# Patient Record
Sex: Female | Born: 1980 | Race: Black or African American | Hispanic: No
Health system: Western US, Academic
[De-identification: ages and names within clinical notes are randomized; demographics above are authoritative.]

## PROBLEM LIST (undated history)

## (undated) DIAGNOSIS — A630 Anogenital (venereal) warts: Secondary | ICD-10-CM

## (undated) DIAGNOSIS — B999 Unspecified infectious disease: Secondary | ICD-10-CM

## (undated) DIAGNOSIS — O365990 Maternal care for other known or suspected poor fetal growth, unspecified trimester, not applicable or unspecified: Secondary | ICD-10-CM

## (undated) DIAGNOSIS — Z8619 Personal history of other infectious and parasitic diseases: Secondary | ICD-10-CM

## (undated) DIAGNOSIS — D649 Anemia, unspecified: Secondary | ICD-10-CM

## (undated) DIAGNOSIS — IMO0002 Reserved for concepts with insufficient information to code with codable children: Secondary | ICD-10-CM

## (undated) DIAGNOSIS — R87619 Unspecified abnormal cytological findings in specimens from cervix uteri: Secondary | ICD-10-CM

## (undated) HISTORY — DX: Reserved for concepts with insufficient information to code with codable children: IMO0002

## (undated) HISTORY — DX: Unspecified abnormal cytological findings in specimens from cervix uteri: R87.619

## (undated) HISTORY — DX: Anemia, unspecified: D64.9

## (undated) HISTORY — DX: Anogenital (venereal) warts: A63.0

## (undated) HISTORY — PX: NO PAST SURGERIES: SHX2092

## (undated) HISTORY — DX: Personal history of other infectious and parasitic diseases: Z86.19

## (undated) HISTORY — DX: Unspecified infectious disease: B99.9

## (undated) DEATH — deceased

---

## 2005-11-07 ENCOUNTER — Emergency Department: Payer: Self-pay | Admitting: Emergency Medicine

## 2006-01-01 ENCOUNTER — Emergency Department: Payer: Self-pay | Admitting: General Practice

## 2006-03-09 ENCOUNTER — Emergency Department: Payer: Self-pay | Admitting: General Practice

## 2009-05-20 ENCOUNTER — Emergency Department: Payer: Self-pay | Admitting: Emergency Medicine

## 2010-02-25 ENCOUNTER — Emergency Department: Payer: Self-pay | Admitting: Unknown Physician Specialty

## 2011-09-23 ENCOUNTER — Emergency Department: Payer: Self-pay | Admitting: *Deleted

## 2011-09-26 ENCOUNTER — Emergency Department: Payer: Self-pay | Admitting: Unknown Physician Specialty

## 2011-10-02 ENCOUNTER — Emergency Department: Payer: Self-pay | Admitting: Emergency Medicine

## 2012-05-10 ENCOUNTER — Emergency Department: Payer: Self-pay | Admitting: *Deleted

## 2012-10-24 IMAGING — CT CT ABD-PELV W/ CM
1 of 2 series · 15 of 32 positions shown, 19 images · non-contrast
Comparison: none

REASON FOR EXAM: (1) **No PO Contrast Please**; Fever, RLQ pain, normal
pelvic exam, normal UA; (
COMMENTS:   May transport without cardiac monitor

PROCEDURE:     CT  - CT ABDOMEN / PELVIS  W  - October 02, 2011  [DATE]
RESULT:     CT abdomen and pelvis dated 10/02/2011.
TECHNIQUE: Helical 3 mm sections were obtained from the lung bases through
the pubic symphysis.

[Series 2: 3mm soft tissue · axial · 0.64mm/px · z∈[-472,-90]mm · 15 of 139 slices shown, 19 images]
[im 6/139  soft-tissue]
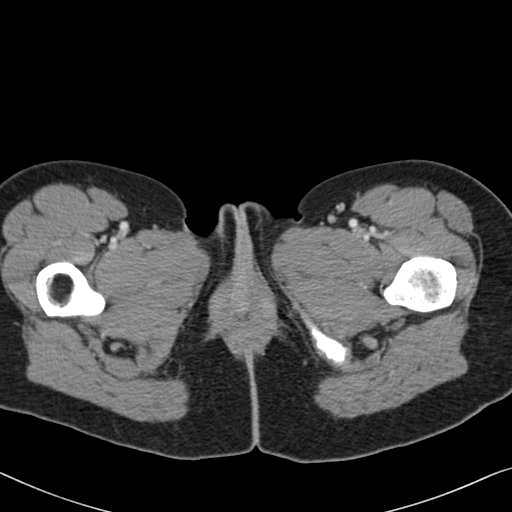
[im 6/139  bone]
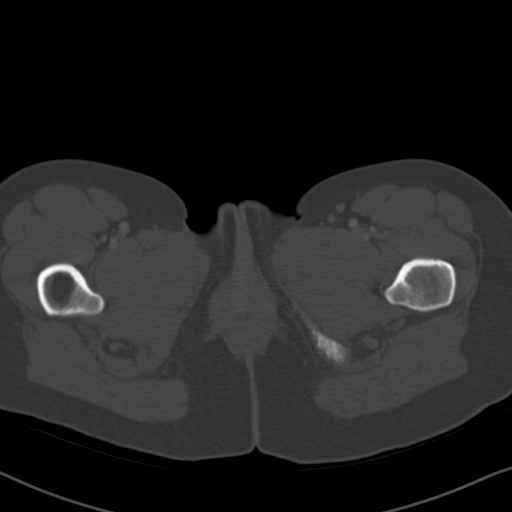
[im 18/139  soft-tissue]
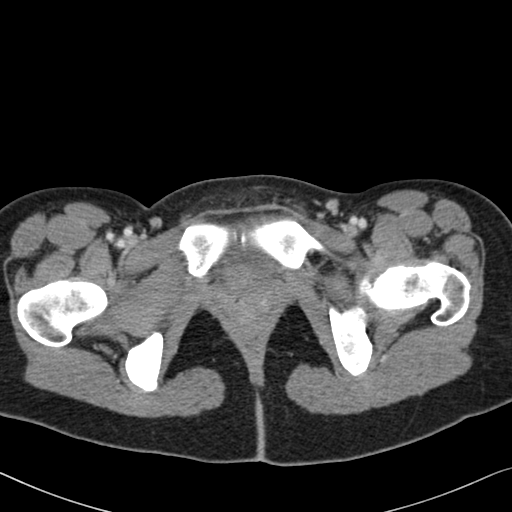
[im 29/139  soft-tissue]
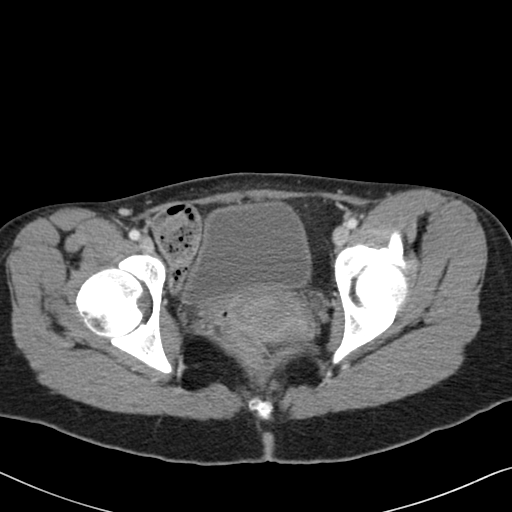
[im 41/139  soft-tissue]
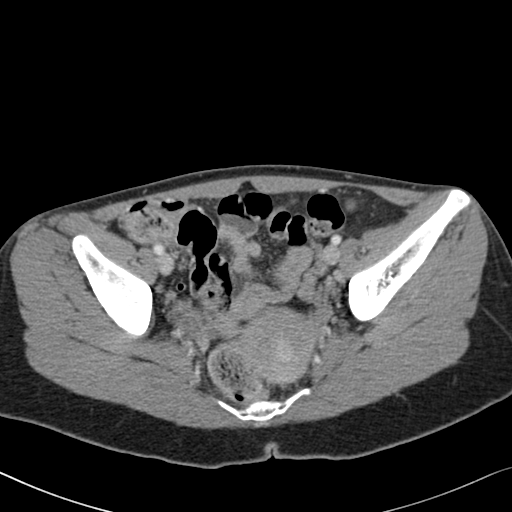
[im 47/139  soft-tissue]
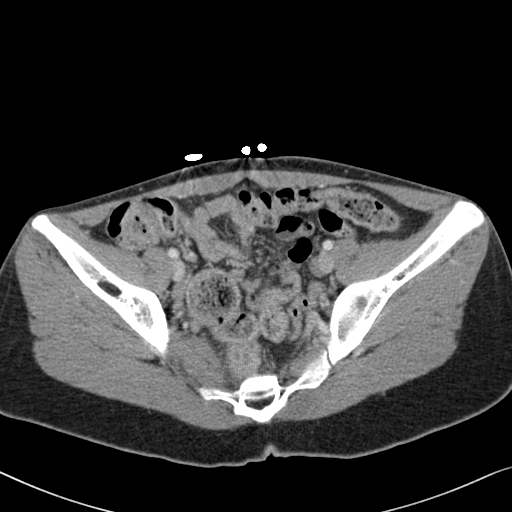
[im 58/139  soft-tissue]
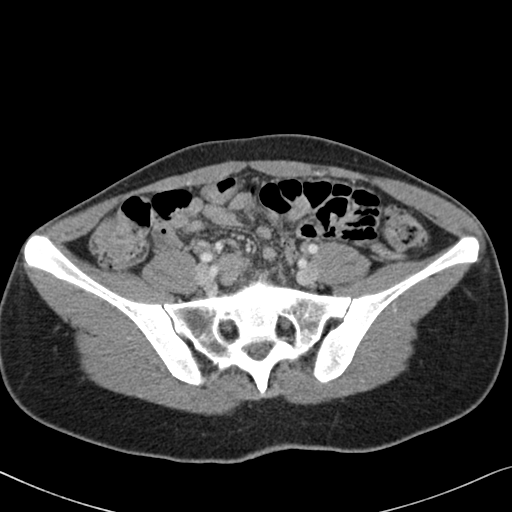
[im 70/139  soft-tissue]
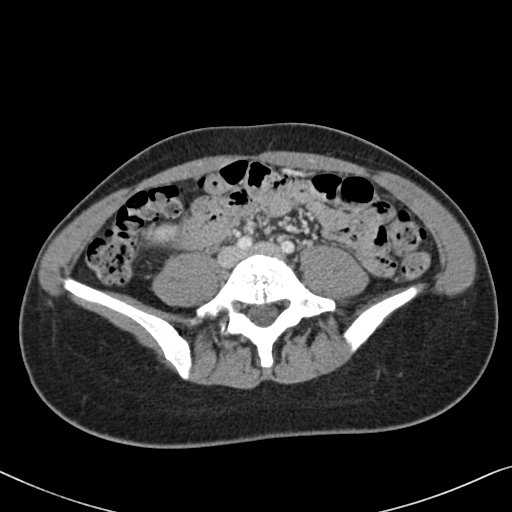
[im 81/139  soft-tissue]
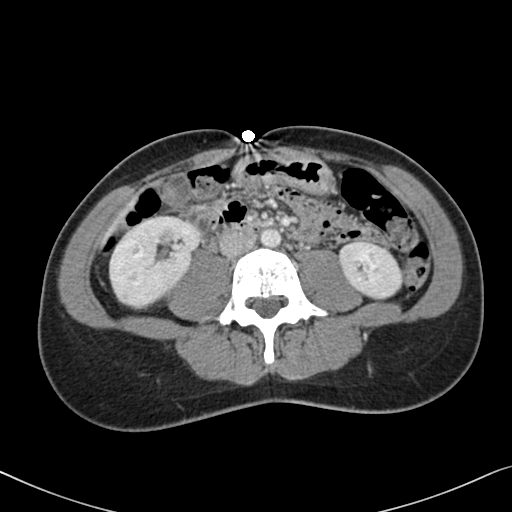
[im 93/139  soft-tissue]
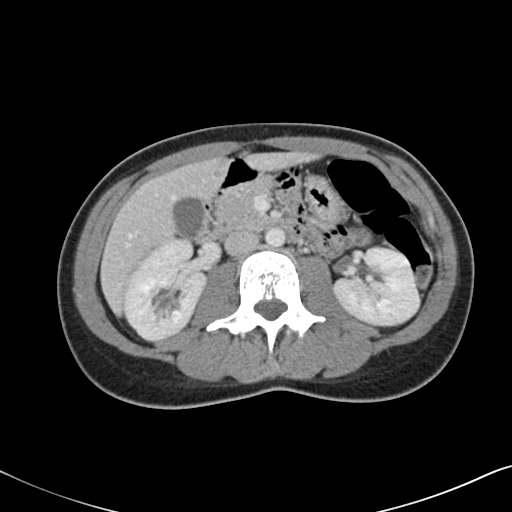
[im 93/139  bone]
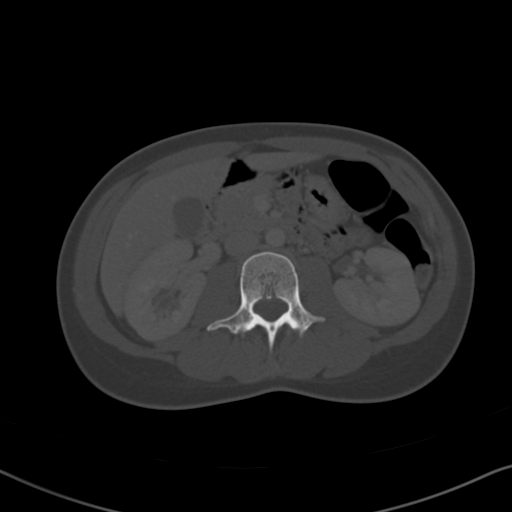
[im 98/139  soft-tissue]
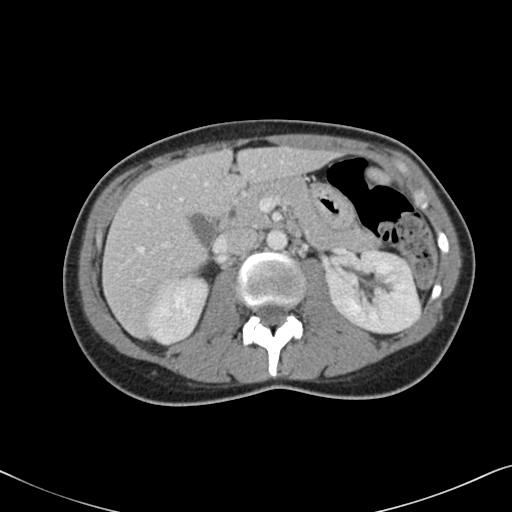
[im 110/139  soft-tissue]
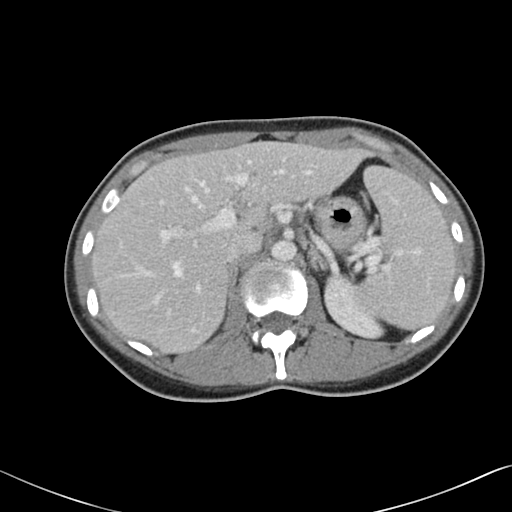
[im 116/139  lung]
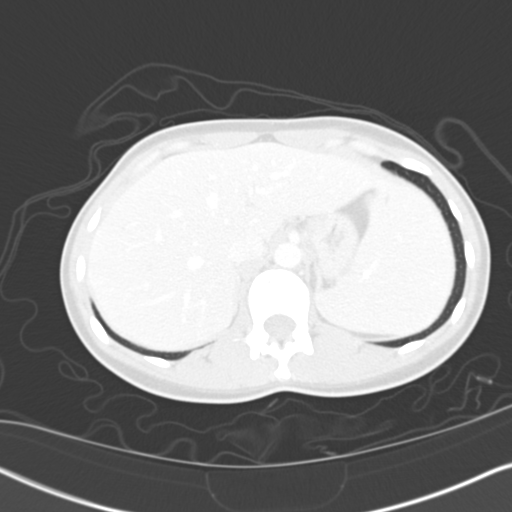
[im 121/139  soft-tissue]
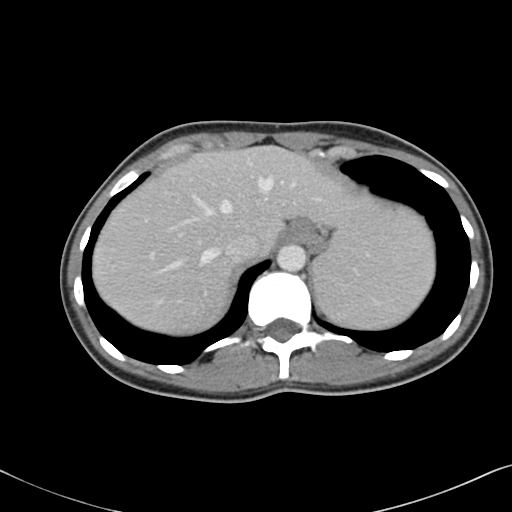
[im 121/139  lung]
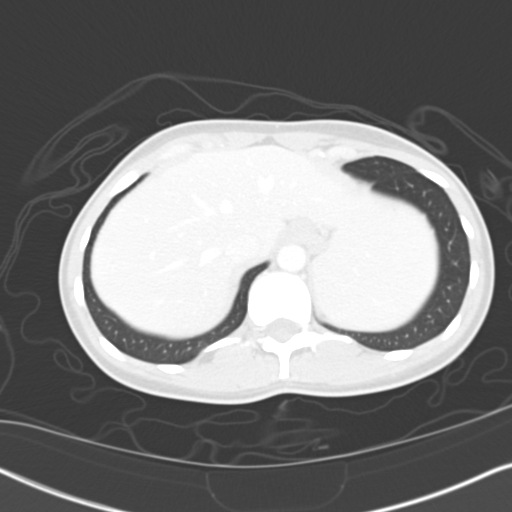
[im 127/139  lung]
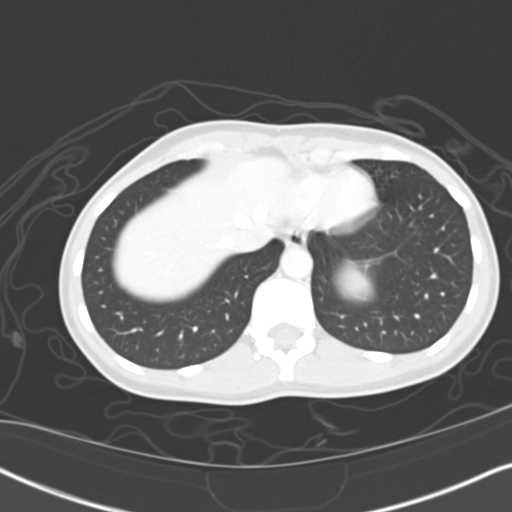
[im 133/139  soft-tissue]
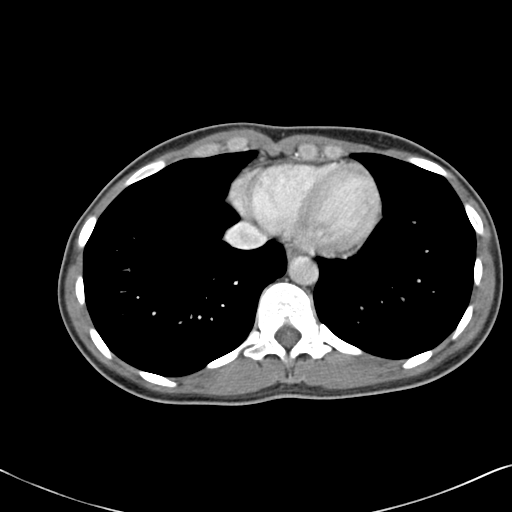
[im 133/139  lung]
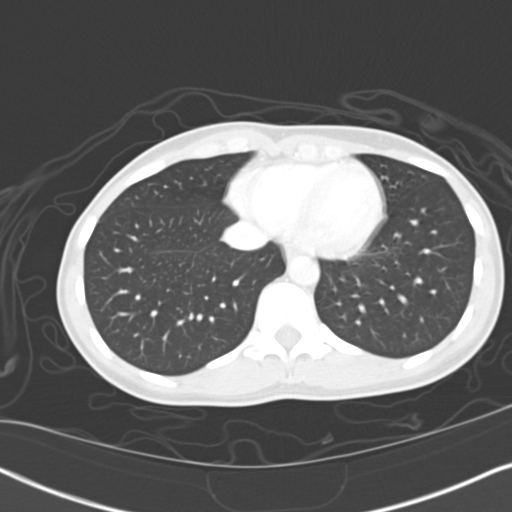

[15 of 32 positions shown; findings below may reference images not displayed]

FINDINGS: There is no evidence of abdominal or pelvic free fluid, loculated
fluid collections, masses, or adenopathy. The appendix is identified and is
unremarkable.

The liver, spleen, adrenals, pancreas are unremarkable few small low
attenuating focus projects within the dome of the right lobe of the liver
likely thinning a small hemangioma versus a small cyst versus a biliary
hamartoma. There is no CT evidence of abdominal aortic aneurysm.
IMPRESSION: No CT evidence of obstructive or inflammatory
abnormalities. Note a moderate amount of stool is appreciated throughout the
colon.

## 2012-10-24 IMAGING — CR DG CHEST 2V
1 series · 2 of 2 positions shown · non-contrast
Comparison: none

REASON FOR EXAM: fever, unclear origin
COMMENTS:

PROCEDURE:     DXR - DXR CHEST PA (OR AP) AND LATERAL  - October 02, 2011  [DATE]
RESULT:     Two-view chest dated 10/02/2011.

[Series 1: view not recorded · 0.17mm/px · 2 of 2 slices shown]
[im 1/2]
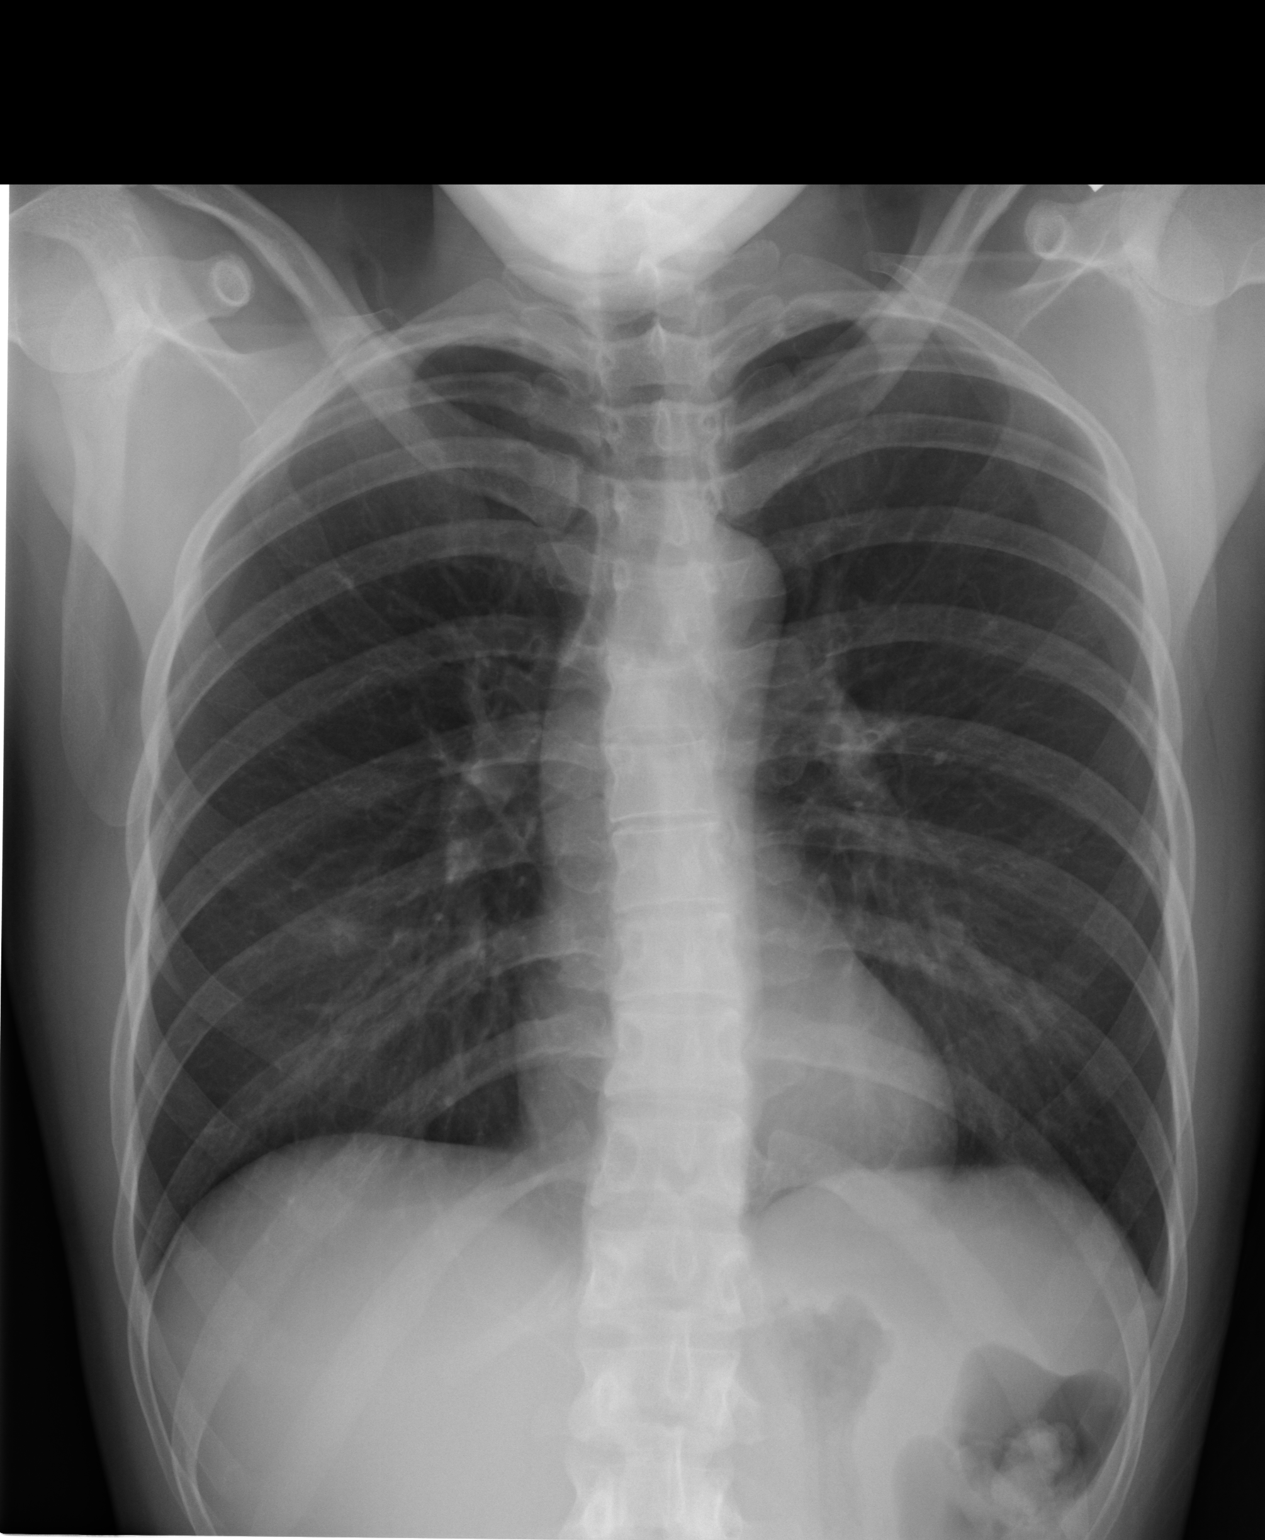
[im 2/2]
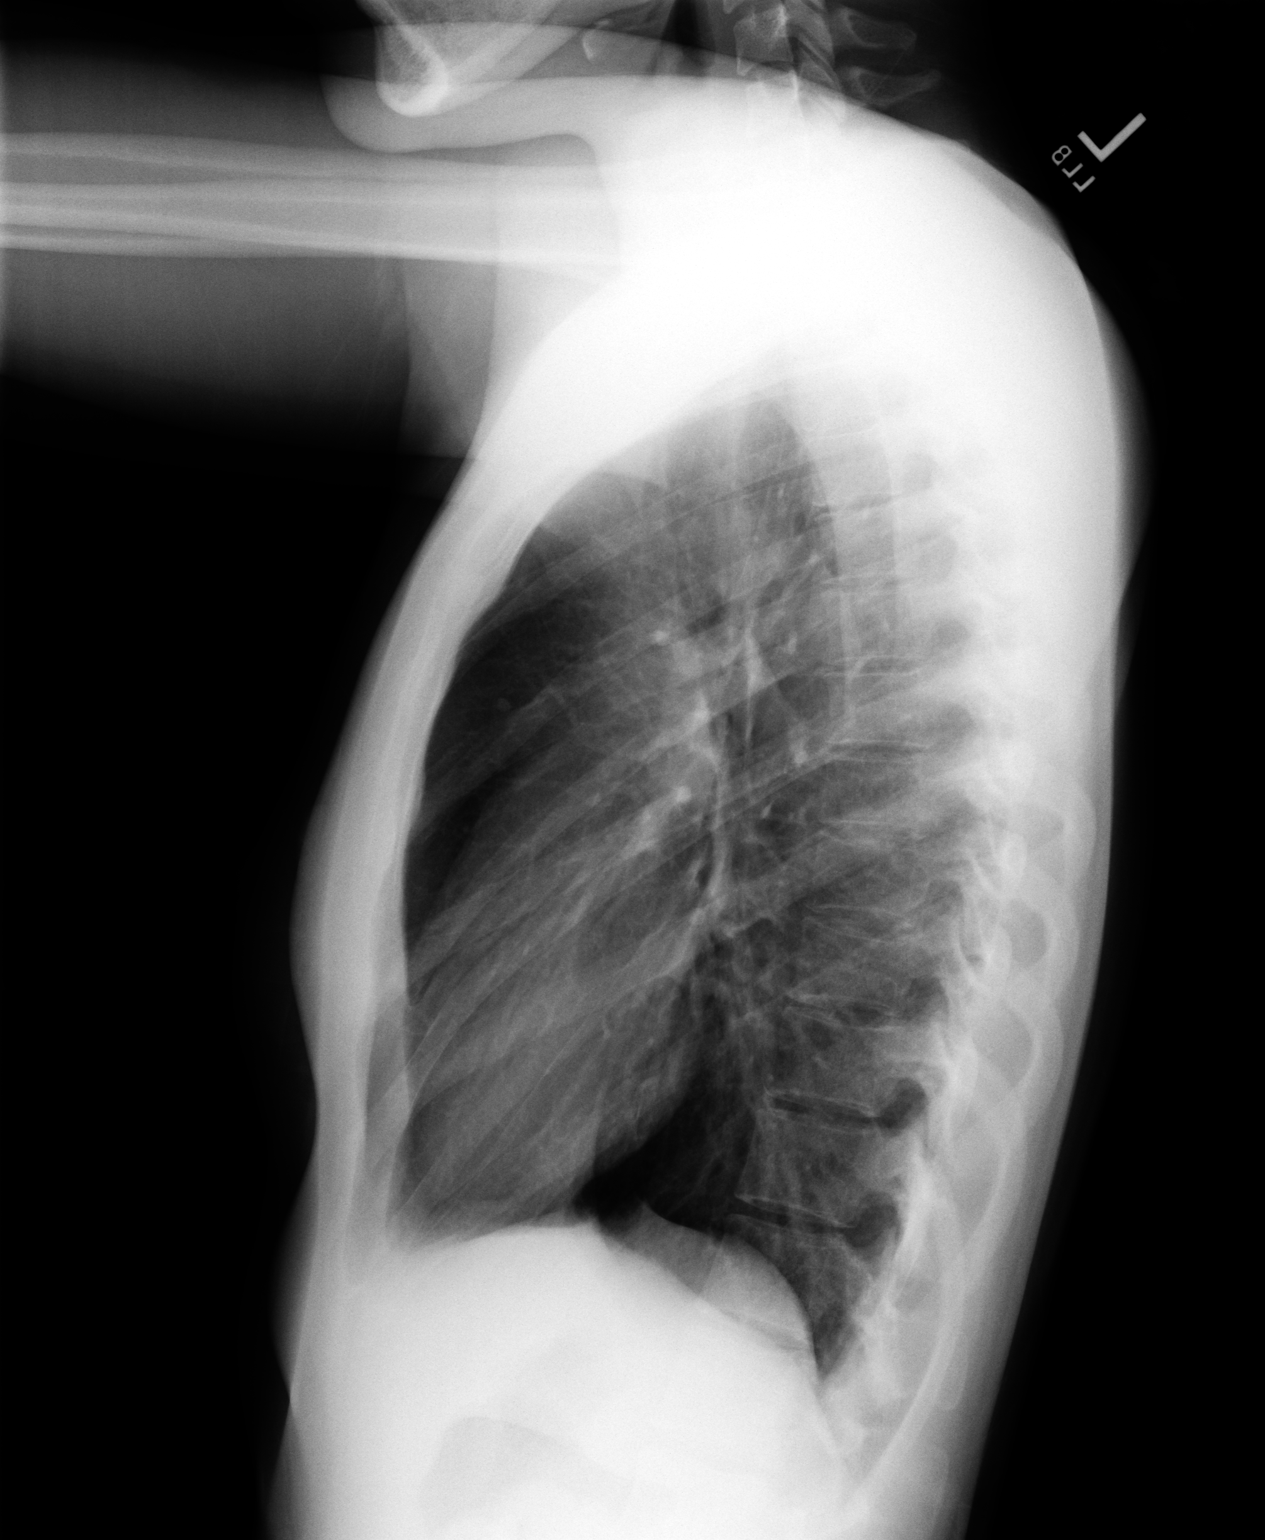

[2 of 2 positions shown; findings below may reference images not displayed]

FINDINGS: Mild area of increased density projects in the region of the
periphery of the right middle lobe. No further focal regions of
consolidation nor focal infiltrates appreciated. Cardiac silhouette and the
visualized bony skeleton are unremarkable.
IMPRESSION: Early or mild infiltrate versus mild atelectasis within the
right lower lobe.

## 2012-11-20 NOTE — L&D Delivery Note (Signed)
Delivery Note Pt pushed with 3 contractions for delivery.  At 4:50 AM a viable and healthy female was delivered via Vaginal, Spontaneous Delivery (Presentation: OA; LOT ).  APGAR: 8,9 ; weight 5#9.  Placenta status:delivered, intact .  Cord: 3V with the following complications: none.    Anesthesia: None  Episiotomy: None Lacerations: Labial abrasions Suture Repair: N/A Est. Blood Loss (mL):400  Mom to postpartum.  Baby to stay with mom and dad.  BOVARD,Hugh Garrow 06/19/2013, 5:07 AM  D/w pt and FOB circumcision for female infant inc r/b/a wish to proceed at hospital - will make payments to hospital and office.    Br/O+/RI/Contra?

## 2013-01-05 ENCOUNTER — Encounter (HOSPITAL_COMMUNITY): Payer: Self-pay | Admitting: Nurse Practitioner

## 2013-01-05 ENCOUNTER — Emergency Department (HOSPITAL_COMMUNITY)
Admission: EM | Admit: 2013-01-05 | Discharge: 2013-01-05 | Disposition: A | Discharge status: Home / Self Care | Payer: Medicaid Other | Attending: Emergency Medicine | Admitting: Emergency Medicine

## 2013-01-05 ENCOUNTER — Emergency Department (HOSPITAL_COMMUNITY): Payer: Medicaid Other

## 2013-01-05 DIAGNOSIS — R109 Unspecified abdominal pain: Secondary | ICD-10-CM | POA: Insufficient documentation

## 2013-01-05 DIAGNOSIS — Z3201 Encounter for pregnancy test, result positive: Secondary | ICD-10-CM | POA: Insufficient documentation

## 2013-01-05 DIAGNOSIS — N898 Other specified noninflammatory disorders of vagina: Secondary | ICD-10-CM

## 2013-01-05 DIAGNOSIS — O9989 Other specified diseases and conditions complicating pregnancy, childbirth and the puerperium: Secondary | ICD-10-CM | POA: Insufficient documentation

## 2013-01-05 DIAGNOSIS — Z3490 Encounter for supervision of normal pregnancy, unspecified, unspecified trimester: Secondary | ICD-10-CM

## 2013-01-05 LAB — CBC WITH DIFFERENTIAL/PLATELET
Basophils Absolute: 0 10*3/uL (ref 0.0–0.1)
Eosinophils Absolute: 0 10*3/uL (ref 0.0–0.7)
Eosinophils Relative: 1 % (ref 0–5)
MCH: 28.2 pg (ref 26.0–34.0)
MCHC: 34.8 g/dL (ref 30.0–36.0)
Neutrophils Relative %: 76 % (ref 43–77)
Platelets: 238 10*3/uL (ref 150–400)
RBC: 3.76 MIL/uL — ABNORMAL LOW (ref 3.87–5.11)
RDW: 15.8 % — ABNORMAL HIGH (ref 11.5–15.5)

## 2013-01-05 LAB — URINALYSIS, MICROSCOPIC ONLY
Bilirubin Urine: NEGATIVE
Leukocytes, UA: NEGATIVE
Nitrite: NEGATIVE
Specific Gravity, Urine: 1.024 (ref 1.005–1.030)
pH: 5.5 (ref 5.0–8.0)

## 2013-01-05 LAB — COMPREHENSIVE METABOLIC PANEL
AST: 31 U/L (ref 0–37)
BUN: 7 mg/dL (ref 6–23)
CO2: 21 mEq/L (ref 19–32)
Chloride: 105 mEq/L (ref 96–112)
Creatinine, Ser: 0.45 mg/dL — ABNORMAL LOW (ref 0.50–1.10)
GFR calc Af Amer: >90 mL/min (ref >90)
GFR calc non Af Amer: >90 mL/min (ref >90)
Glucose, Bld: 96 mg/dL (ref 70–99)
Total Bilirubin: 0.3 mg/dL (ref 0.3–1.2)

## 2013-01-05 LAB — HCG, QUANTITATIVE, PREGNANCY: hCG, Beta Chain, Quant, S: 32356 m[IU]/mL — ABNORMAL HIGH (ref <5)

## 2013-01-05 LAB — WET PREP, GENITAL: Yeast Wet Prep HPF POC: NONE SEEN

## 2013-01-05 MED ORDER — METRONIDAZOLE 500 MG PO TABS: 500.0000 mg | ORAL_TABLET | Freq: Two times a day (BID) | ORAL | Status: DC

## 2013-01-05 NOTE — ED Notes (Signed)
Pt states "im really just trying to see what is going on with my baby." Does not see md, pt found out she was pregnant in dec has not had any prenatal care. Pt states she has been having sharp stomach pain and back pain for a month. Denies bleeding, n/v.

## 2013-01-05 NOTE — ED Notes (Signed)
Pt reports she is approx [redacted] weeks pregnant, over past week reports sharp intermittetn abd pain and back pain when standing for long periods of time. Reports white vag discharge since last week. States she wants to have the baby checked out, has not had OBGYN care yet

## 2013-01-05 NOTE — ED Notes (Signed)
Denies pain today, states she had pain last night

## 2013-01-05 NOTE — ED Provider Notes (Signed)
History     CSN: [id number]  Arrival date & time 01/05/13  1057   First MD Initiated Contact with Patient 01/05/13 1130      Chief Complaint  Patient presents with  . Abdominal Pain    (Consider location/radiation/quality/duration/timing/severity/associated sxs/prior treatment) Patient is a 32 y.o. female presenting with abdominal pain. The history is provided by the patient.  Abdominal Pain Associated symptoms: vaginal discharge   Associated symptoms: no chest pain, no chills, no constipation, no diarrhea, no dysuria, no fever, no shortness of breath, no vaginal bleeding and no vomiting   pt states she is approximated [redacted] weeks pregnant, states had a positive pregnancy test at health dept a couple months ago, lnmp 11/6. Indicates in past couple weeks, intermittent lower abdominal crampy discomfort, bilateral, no specific exacerbating or alleviating factors. Also indicates mild whitish vaginal discharge, notes hx BV.  Normal appetite. No nvd. No constipation or diarrhea. No abd distension. No constant, focal abd pain. No fever or chills. No vaginal bleeding. G5p1 (32 yo), 3 prior elective ab. No current ob/gyn provider. Denies dysuria or hematuria. No flank pain.    No past medical history on file.  No past surgical history on file.  No family history on file.  History  Substance Use Topics  . Smoking status: Never Smoker   . Smokeless tobacco: Not on file  . Alcohol Use: No    OB History   Grav Para Term Preterm Abortions TAB SAB Ect Mult Living                  Review of Systems  Constitutional: Negative for fever and chills.  HENT: Negative for neck pain.   Eyes: Negative for redness.  Respiratory: Negative for shortness of breath.   Cardiovascular: Negative for chest pain.  Gastrointestinal: Positive for abdominal pain. Negative for vomiting, diarrhea and constipation.  Genitourinary: Positive for vaginal discharge. Negative for dysuria, flank pain and vaginal  bleeding.  Musculoskeletal: Negative for back pain.  Skin: Negative for rash.  Neurological: Negative for headaches.  Hematological: Does not bruise/bleed easily.  Psychiatric/Behavioral: Negative for confusion.    Allergies  Review of patient's allergies indicates no known allergies.  Home Medications  No current outpatient prescriptions on file.  BP 105/72  Pulse 93  Temp(Src) 98 F (36.7 C) (Oral)  Resp 16  SpO2 100%  LMP 09/25/2012  Physical Exam  Nursing note and vitals reviewed. Constitutional: She appears well-developed and well-nourished. No distress.  HENT:  Mouth/Throat: Oropharynx is clear and moist.  Eyes: Conjunctivae are normal. No scleral icterus.  Neck: Neck supple. No tracheal deviation present.  Cardiovascular: Normal rate, regular rhythm, normal heart sounds and intact distal pulses.   Pulmonary/Chest: Effort normal and breath sounds normal. No respiratory distress.  Abdominal: Soft. Normal appearance and bowel sounds are normal. She exhibits no distension and no mass. There is no tenderness. There is no rebound and no guarding.  Genitourinary:  No cva tenderness. Normal ext exam. +whitish vaginal discharge. Cervix closed. No cmt. No adx masses/tenderness.   Musculoskeletal: She exhibits no edema.  Neurological: She is alert.  Skin: Skin is warm and dry. No rash noted.  Psychiatric: She has a normal mood and affect.    ED Course  Procedures (including critical care time)  Results for orders placed during the hospital encounter of 01/05/13  WET PREP, GENITAL      Result Value Range   Yeast Wet Prep HPF POC NONE SEEN  NONE SEEN  Trich, Wet Prep NONE SEEN  NONE SEEN   Clue Cells Wet Prep HPF POC FEW (*) NONE SEEN   WBC, Wet Prep HPF POC FEW (*) NONE SEEN  URINALYSIS, MICROSCOPIC ONLY      Result Value Range   Color, Urine YELLOW  YELLOW   APPearance CLOUDY (*) CLEAR   Specific Gravity, Urine 1.024  1.005 - 1.030   pH 5.5  5.0 - 8.0   Glucose,  UA NEGATIVE  NEGATIVE mg/dL   Hgb urine dipstick NEGATIVE  NEGATIVE   Bilirubin Urine NEGATIVE  NEGATIVE   Ketones, ur NEGATIVE  NEGATIVE mg/dL   Protein, ur NEGATIVE  NEGATIVE mg/dL   Urobilinogen, UA 0.2  0.0 - 1.0 mg/dL   Nitrite NEGATIVE  NEGATIVE   Leukocytes, UA NEGATIVE  NEGATIVE   WBC, UA 0-2  <3 WBC/hpf   Bacteria, UA RARE  RARE   Squamous Epithelial / LPF RARE  RARE   Urine-Other MUCOUS PRESENT    CBC WITH DIFFERENTIAL      Result Value Range   WBC 7.3  4.0 - 10.5 K/uL   RBC 3.76 (*) 3.87 - 5.11 MIL/uL   Hemoglobin 10.6 (*) 12.0 - 15.0 g/dL   HCT 14.7 (*) 82.9 - 56.2 %   MCV 81.1  78.0 - 100.0 fL   MCH 28.2  26.0 - 34.0 pg   MCHC 34.8  30.0 - 36.0 g/dL   RDW 13.0 (*) 86.5 - 78.4 %   Platelets 238  150 - 400 K/uL   Neutrophils Relative 76  43 - 77 %   Neutro Abs 5.6  1.7 - 7.7 K/uL   Lymphocytes Relative 19  12 - 46 %   Lymphs Abs 1.4  0.7 - 4.0 K/uL   Monocytes Relative 4  3 - 12 %   Monocytes Absolute 0.3  0.1 - 1.0 K/uL   Eosinophils Relative 1  0 - 5 %   Eosinophils Absolute 0.0  0.0 - 0.7 K/uL   Basophils Relative 0  0 - 1 %   Basophils Absolute 0.0  0.0 - 0.1 K/uL  COMPREHENSIVE METABOLIC PANEL      Result Value Range   Sodium 137  135 - 145 mEq/L   Potassium 3.6  3.5 - 5.1 mEq/L   Chloride 105  96 - 112 mEq/L   CO2 21  19 - 32 mEq/L   Glucose, Bld 96  70 - 99 mg/dL   BUN 7  6 - 23 mg/dL   Creatinine, Ser 6.96 (*) 0.50 - 1.10 mg/dL   Calcium 8.9  8.4 - 29.5 mg/dL   Total Protein 6.8  6.0 - 8.3 g/dL   Albumin 3.0 (*) 3.5 - 5.2 g/dL   AST 31  0 - 37 U/L   ALT 22  0 - 35 U/L   Alkaline Phosphatase 46  39 - 117 U/L   Total Bilirubin 0.3  0.3 - 1.2 mg/dL   GFR calc non Af Amer >90  >90 mL/min   GFR calc Af Amer >90  >90 mL/min  LIPASE, BLOOD      Result Value Range   Lipase 21  11 - 59 U/L  HCG, QUANTITATIVE, PREGNANCY      Result Value Range   hCG, Beta Chain, Quant, S 32356 (*) <5 mIU/mL  POCT PREGNANCY, URINE      Result Value Range   Preg Test,  Ur POSITIVE (*) NEGATIVE   US Ob Limited  01/05/2013  *RADIOLOGY REPORT*  Clinical Data:  32 year old pregnant female with pelvic pain.  LIMITED OBSTETRIC ULTRASOUND AND TRANSVAGINAL OBSTETRIC ULTRASOUND  Number of Fetuses: 1 Heart Rate:  144 bpm Movement:  Present Presentation:  Variable Placental Location:  Fundal Previa:  Marginal Amniotic Fluid (Subjective):  Normal  BPD:  2.8cm 15w  1d     EDC: 06/28/2013  MATERNAL FINDINGS: Cervix: Closed Uterus/Adnexae: Not evaluated  IMPRESSION: Single living intrauterine gestation with estimated gestational age of [redacted] weeks 1 day by this ultrasound.  Marginal/ low-lying placenta - recommend follow-up.  This exam is performed on an emergent basis and does not comprehensively evaluate fetal size, dating, or anatomy, and a follow-up complete OB US should be considered if further fetal assessment is warranted.   Original Report Authenticated By: Harmon Pier, M.D.        MDM  Labs. U/s.  Reviewed nursing notes and prior charts for additional history.    15 week IUP on u/s. Recheck no pain. abd soft nt.   Discussed need ob/gyn follow up.  Vaginal discharge w clue cells, rx flagyl. No bleeding.          Suzi Roots, MD 01/05/13 [phone]

## 2013-02-19 LAB — OB RESULTS CONSOLE HEPATITIS B SURFACE ANTIGEN: Hepatitis B Surface Ag: NEGATIVE

## 2013-02-19 LAB — OB RESULTS CONSOLE HIV ANTIBODY (ROUTINE TESTING): HIV: NONREACTIVE

## 2013-02-19 LAB — OB RESULTS CONSOLE ABO/RH: RH Type: POSITIVE

## 2013-05-27 LAB — OB RESULTS CONSOLE GC/CHLAMYDIA: Gonorrhea: NEGATIVE

## 2013-05-27 LAB — OB RESULTS CONSOLE HIV ANTIBODY (ROUTINE TESTING): HIV: NONREACTIVE

## 2013-06-02 LAB — OB RESULTS CONSOLE GBS: GBS: POSITIVE

## 2013-06-18 ENCOUNTER — Telehealth (HOSPITAL_COMMUNITY): Payer: Self-pay | Admitting: *Deleted

## 2013-06-18 ENCOUNTER — Inpatient Hospital Stay (HOSPITAL_COMMUNITY)
Admission: AD | Admit: 2013-06-18 | Discharge: 2013-06-21 | DRG: 775 | Disposition: A | Discharge status: Home / Self Care | Payer: Medicaid Other | Source: Ambulatory Visit | Attending: Obstetrics and Gynecology | Admitting: Obstetrics and Gynecology

## 2013-06-18 ENCOUNTER — Encounter (HOSPITAL_COMMUNITY): Payer: Self-pay | Admitting: *Deleted

## 2013-06-18 ENCOUNTER — Other Ambulatory Visit: Payer: Self-pay | Admitting: Obstetrics and Gynecology

## 2013-06-18 ENCOUNTER — Encounter (HOSPITAL_COMMUNITY): Payer: Self-pay | Admitting: Obstetrics and Gynecology

## 2013-06-18 DIAGNOSIS — O99892 Other specified diseases and conditions complicating childbirth: Secondary | ICD-10-CM | POA: Diagnosis present

## 2013-06-18 DIAGNOSIS — O365939 Maternal care for other known or suspected poor fetal growth, third trimester, other fetus: Secondary | ICD-10-CM

## 2013-06-18 DIAGNOSIS — O365990 Maternal care for other known or suspected poor fetal growth, unspecified trimester, not applicable or unspecified: Principal | ICD-10-CM

## 2013-06-18 DIAGNOSIS — Z22330 Carrier of Group B streptococcus: Secondary | ICD-10-CM

## 2013-06-18 HISTORY — DX: Maternal care for other known or suspected poor fetal growth, unspecified trimester, not applicable or unspecified: O36.5990

## 2013-06-18 LAB — CBC
HCT: 35.7 % — ABNORMAL LOW (ref 36.0–46.0)
Hemoglobin: 11.9 g/dL — ABNORMAL LOW (ref 12.0–15.0)
MCV: 86 fL (ref 78.0–100.0)
RDW: 14.2 % (ref 11.5–15.5)
WBC: 9.7 10*3/uL (ref 4.0–10.5)

## 2013-06-18 LAB — TYPE AND SCREEN
ABO/RH(D): O POS
Antibody Screen: NEGATIVE

## 2013-06-18 LAB — ABO/RH: ABO/RH(D): O POS

## 2013-06-18 MED ORDER — OXYTOCIN 40 UNITS IN LACTATED RINGERS INFUSION - SIMPLE MED: 62.5000 mL/h | INTRAVENOUS | Status: DC

## 2013-06-18 MED ORDER — LACTATED RINGERS IV SOLN: 500.0000 mL | INTRAVENOUS | Status: DC | PRN

## 2013-06-18 MED ORDER — PENICILLIN G POTASSIUM 5000000 UNITS IJ SOLR
5.0000 10*6.[IU] | Freq: Once | INTRAVENOUS | Status: AC
  Administered 2013-06-18: 5 10*6.[IU] via INTRAVENOUS
  Filled 2013-06-18: qty 5

## 2013-06-18 MED ORDER — TERBUTALINE SULFATE 1 MG/ML IJ SOLN: 0.2500 mg | Freq: Once | INTRAMUSCULAR | Status: AC | PRN

## 2013-06-18 MED ORDER — PENICILLIN G POTASSIUM 5000000 UNITS IJ SOLR
2.5000 10*6.[IU] | INTRAVENOUS | Status: DC
  Administered 2013-06-19 (×2): 2.5 10*6.[IU] via INTRAVENOUS
  Filled 2013-06-18 (×5): qty 2.5

## 2013-06-18 MED ORDER — MISOPROSTOL 25 MCG QUARTER TABLET
25.0000 ug | ORAL_TABLET | ORAL | Status: DC | PRN
  Administered 2013-06-18: 25 ug via VAGINAL
  Filled 2013-06-18: qty 0.25
  Filled 2013-06-18: qty 1

## 2013-06-18 MED ORDER — CITRIC ACID-SODIUM CITRATE 334-500 MG/5ML PO SOLN: 30.0000 mL | ORAL | Status: DC | PRN

## 2013-06-18 MED ORDER — BUTORPHANOL TARTRATE 1 MG/ML IJ SOLN: 2.0000 mg | INTRAMUSCULAR | Status: DC | PRN

## 2013-06-18 MED ORDER — OXYTOCIN BOLUS FROM INFUSION: 500.0000 mL | INTRAVENOUS | Status: DC

## 2013-06-18 MED ORDER — LIDOCAINE HCL (PF) 1 % IJ SOLN
30.0000 mL | INTRAMUSCULAR | Status: DC | PRN
  Filled 2013-06-18 (×2): qty 30

## 2013-06-18 MED ORDER — OXYCODONE-ACETAMINOPHEN 5-325 MG PO TABS: 1.0000 | ORAL_TABLET | ORAL | Status: DC | PRN

## 2013-06-18 MED ORDER — ONDANSETRON HCL 4 MG/2ML IJ SOLN: 4.0000 mg | Freq: Four times a day (QID) | INTRAMUSCULAR | Status: DC | PRN

## 2013-06-18 MED ORDER — ACETAMINOPHEN 325 MG PO TABS: 650.0000 mg | ORAL_TABLET | ORAL | Status: DC | PRN

## 2013-06-18 MED ORDER — OXYTOCIN 40 UNITS IN LACTATED RINGERS INFUSION - SIMPLE MED
1.0000 m[IU]/min | INTRAVENOUS | Status: DC
  Administered 2013-06-19: 2 m[IU]/min via INTRAVENOUS
  Filled 2013-06-18: qty 1000

## 2013-06-18 MED ORDER — LACTATED RINGERS IV SOLN
INTRAVENOUS | Status: DC
  Administered 2013-06-18 – 2013-06-19 (×2): via INTRAVENOUS

## 2013-06-18 MED ORDER — IBUPROFEN 600 MG PO TABS: 600.0000 mg | ORAL_TABLET | Freq: Four times a day (QID) | ORAL | Status: DC | PRN

## 2013-06-18 NOTE — Telephone Encounter (Signed)
Preadmission screen  

## 2013-06-18 NOTE — Plan of Care (Signed)
Problem: Consults Goal: Birthing Suites Patient Information Press F2 to bring up selections list  Outcome: Completed/Met Date Met:  06/18/13  Pt < [redacted] weeks EGA

## 2013-06-18 NOTE — Progress Notes (Signed)
Patient ID: Donna Harrington, female   DOB: 12-17-1980, 32 y.o.   MRN: [id number]  Doing well.  No feeling much  AFVSS gen NAD FHTs 120's good var, category 1 toco irr  AROM for clear fluid w/o diff/comp 3/50/-2  32 yo G2P1001 at 38+ IOL for IUGR Continue IOL with pitocin Expect SVD

## 2013-06-18 NOTE — H&P (Signed)
Donna Harrington is a 32 y.o. female G2P1001 at 38+ with IUGR < 10% and normal dopplers for IOL.  Pregnancy complicated by late prenatal care and IUGR.  +FM, no LOF, no VB, occ ctx.  D/W pt IOL and POC   Maternal Medical History:  Contractions: Frequency: irregular.    Fetal activity: Perceived fetal activity is normal.    Prenatal complications: IUGR.   Prenatal Complications - Diabetes: none.    OB History   Grav Para Term Preterm Abortions TAB SAB Ect Mult Living   5 1 1  3 3    1     G1 41wk SVD 8#1 female G2-G4 TAB 8 wk G5 present IUGR  + abn pap, colpo, nl since H/o GC/Chl  Past Medical History  Diagnosis Date  . Hx of varicella   . Anemia   . Abnormal Pap smear   . Hx of chlamydia infection   . Hx of gonorrhea   . HPV (human papilloma virus) anogenital infection   . Infection     uti, recurrent yeast and BV  . IUGR (intrauterine growth restriction) affecting care of mother 06/18/2013   Past Surgical History  Procedure Laterality Date  . No past surgeries    D&C x 3  Family History: family history includes Heart disease in her paternal grandmother; Hypertension in her other; and Stroke in her paternal grandmother.  There is no history of Alcohol abuse, and Arthritis, and Asthma, and Birth defects, and Cancer, and COPD, and Diabetes, and Depression, and Drug abuse, and Early death, and Hearing loss, and Hyperlipidemia, and Mental illness, and Learning disabilities, and Kidney disease, and Mental retardation, and Miscarriages / Stillbirths, and Vision loss, . Social History:  reports that she has never smoked. She has never used smokeless tobacco. She reports that she does not drink alcohol or use illicit drugs.  Meds PNV All NKDA   Prenatal Transfer Tool  Maternal Diabetes: No Genetic Screening: Normal Maternal Ultrasounds/Referrals: Abnormal:  Findings:   IUGR Fetal Ultrasounds or other Referrals:  None Maternal Substance Abuse:  No Significant Maternal  Medications:  None Significant Maternal Lab Results:  Lab values include: Group B Strep positive Other Comments:  IUGR < 10%, nl dopplers  Review of Systems  Constitutional: Negative.   HENT: Negative.   Eyes: Negative.   Respiratory: Negative.   Cardiovascular: Negative.   Gastrointestinal: Negative.   Genitourinary: Negative.   Musculoskeletal: Negative.   Skin: Negative.   Neurological: Negative.   Psychiatric/Behavioral: Negative.       Last menstrual period 09/25/2012. Maternal Exam:  Uterine Assessment: Contraction frequency is irregular.   Abdomen: Fundal height is SGA.   Estimated fetal weight is 5-6#.   Fetal presentation: vertex  Introitus: Normal vulva. Normal vagina.  Cervix: Cervix evaluated by digital exam.     Physical Exam  Constitutional: She is oriented to person, place, and time. She appears well-developed and well-nourished.  HENT:  Head: Normocephalic and atraumatic.  Cardiovascular: Normal rate and regular rhythm.   Respiratory: Effort normal and breath sounds normal. No respiratory distress.  GI: Soft. Bowel sounds are normal. There is no tenderness.  Musculoskeletal: Normal range of motion.  Neurological: She is alert and oriented to person, place, and time.  Skin: Skin is warm and dry.  Psychiatric: She has a normal mood and affect. Her behavior is normal.    Prenatal labs: ABO, Rh: O/Positive/-- (04/02 0000) Antibody: Negative (04/02 0000) Rubella: Immune (04/02 0000) RPR: Nonreactive (07/08 0000)  HBsAg: Negative (  04/02 0000)  HIV: Non-reactive (07/08 0000)  GBS: Positive (07/14 0000)   Hgb 10.8/ Ur Cx neg/ Hgb electro WNL/ GC neg/ Chl neg/ CF neg/ AFP WNL/ glucola 110/ Plt 237K  Tdap 04/09/13  [place] < 10% growth, nl dopplers  Assessment/Plan: 31yo G5P1031 at 38+ for IOL given IUGR gbbs + - prophylaxis with PCN Epidural/IV pain meds PRN Expect SVD IOL with cytotec/pitocin  Donna Harrington,Donna Harrington 06/18/2013, 6:25 PM

## 2013-06-19 ENCOUNTER — Encounter (HOSPITAL_COMMUNITY): Payer: Self-pay | Admitting: Obstetrics and Gynecology

## 2013-06-19 ENCOUNTER — Encounter (HOSPITAL_COMMUNITY): Payer: Self-pay | Admitting: Anesthesiology

## 2013-06-19 ENCOUNTER — Inpatient Hospital Stay (HOSPITAL_COMMUNITY): Payer: Medicaid Other | Admitting: Anesthesiology

## 2013-06-19 ENCOUNTER — Inpatient Hospital Stay (HOSPITAL_COMMUNITY): Admission: RE | Admit: 2013-06-19 | Payer: Medicaid Other | Source: Ambulatory Visit

## 2013-06-19 MED ORDER — ONDANSETRON HCL 4 MG/2ML IJ SOLN: 4.0000 mg | INTRAMUSCULAR | Status: DC | PRN

## 2013-06-19 MED ORDER — PHENYLEPHRINE 40 MCG/ML (10ML) SYRINGE FOR IV PUSH (FOR BLOOD PRESSURE SUPPORT)
80.0000 ug | PREFILLED_SYRINGE | INTRAVENOUS | Status: DC | PRN
  Filled 2013-06-19: qty 2

## 2013-06-19 MED ORDER — LACTATED RINGERS IV SOLN: INTRAVENOUS | Status: DC

## 2013-06-19 MED ORDER — DIPHENHYDRAMINE HCL 50 MG/ML IJ SOLN: 12.5000 mg | INTRAMUSCULAR | Status: DC | PRN

## 2013-06-19 MED ORDER — DIPHENHYDRAMINE HCL 25 MG PO CAPS
25.0000 mg | ORAL_CAPSULE | Freq: Four times a day (QID) | ORAL | Status: DC | PRN
  Administered 2013-06-19: 25 mg via ORAL
  Filled 2013-06-19: qty 1

## 2013-06-19 MED ORDER — TETANUS-DIPHTH-ACELL PERTUSSIS 5-2.5-18.5 LF-MCG/0.5 IM SUSP: 0.5000 mL | Freq: Once | INTRAMUSCULAR | Status: DC

## 2013-06-19 MED ORDER — SIMETHICONE 80 MG PO CHEW: 80.0000 mg | CHEWABLE_TABLET | ORAL | Status: DC | PRN

## 2013-06-19 MED ORDER — OXYCODONE-ACETAMINOPHEN 5-325 MG PO TABS: 1.0000 | ORAL_TABLET | ORAL | Status: DC | PRN

## 2013-06-19 MED ORDER — FENTANYL 2.5 MCG/ML BUPIVACAINE 1/10 % EPIDURAL INFUSION (WH - ANES)
14.0000 mL/h | INTRAMUSCULAR | Status: DC | PRN
  Administered 2013-06-19: 14 mL/h via EPIDURAL

## 2013-06-19 MED ORDER — BENZOCAINE-MENTHOL 20-0.5 % EX AERO
1.0000 "application " | INHALATION_SPRAY | CUTANEOUS | Status: DC | PRN
  Filled 2013-06-19: qty 56

## 2013-06-19 MED ORDER — LANOLIN HYDROUS EX OINT: TOPICAL_OINTMENT | CUTANEOUS | Status: DC | PRN

## 2013-06-19 MED ORDER — IBUPROFEN 600 MG PO TABS
600.0000 mg | ORAL_TABLET | Freq: Four times a day (QID) | ORAL | Status: DC
  Administered 2013-06-19 – 2013-06-21 (×8): 600 mg via ORAL
  Filled 2013-06-19 (×9): qty 1

## 2013-06-19 MED ORDER — SENNOSIDES-DOCUSATE SODIUM 8.6-50 MG PO TABS
2.0000 | ORAL_TABLET | Freq: Every day | ORAL | Status: DC
  Administered 2013-06-20: 2 via ORAL

## 2013-06-19 MED ORDER — DIBUCAINE 1 % RE OINT: 1.0000 "application " | TOPICAL_OINTMENT | RECTAL | Status: DC | PRN

## 2013-06-19 MED ORDER — ZOLPIDEM TARTRATE 5 MG PO TABS: 5.0000 mg | ORAL_TABLET | Freq: Every evening | ORAL | Status: DC | PRN

## 2013-06-19 MED ORDER — EPHEDRINE 5 MG/ML INJ
INTRAVENOUS | Status: AC
Start: 2013-06-19 — End: 2013-06-19
  Filled 2013-06-19: qty 4

## 2013-06-19 MED ORDER — EPHEDRINE 5 MG/ML INJ
10.0000 mg | INTRAVENOUS | Status: DC | PRN
  Filled 2013-06-19: qty 2

## 2013-06-19 MED ORDER — ONDANSETRON HCL 4 MG PO TABS: 4.0000 mg | ORAL_TABLET | ORAL | Status: DC | PRN

## 2013-06-19 MED ORDER — LACTATED RINGERS IV SOLN
500.0000 mL | Freq: Once | INTRAVENOUS | Status: AC
Start: 2013-06-19 — End: 2013-06-19
  Administered 2013-06-19: 500 mL via INTRAVENOUS

## 2013-06-19 MED ORDER — WITCH HAZEL-GLYCERIN EX PADS: 1.0000 "application " | MEDICATED_PAD | CUTANEOUS | Status: DC | PRN

## 2013-06-19 MED ORDER — PHENYLEPHRINE 40 MCG/ML (10ML) SYRINGE FOR IV PUSH (FOR BLOOD PRESSURE SUPPORT)
PREFILLED_SYRINGE | INTRAVENOUS | Status: AC
  Filled 2013-06-19: qty 5

## 2013-06-19 MED ORDER — FENTANYL 2.5 MCG/ML BUPIVACAINE 1/10 % EPIDURAL INFUSION (WH - ANES)
INTRAMUSCULAR | Status: AC
  Filled 2013-06-19: qty 125

## 2013-06-19 MED ORDER — LIDOCAINE HCL (PF) 1 % IJ SOLN
INTRAMUSCULAR | Status: DC | PRN
  Administered 2013-06-19: 5 mL

## 2013-06-19 MED ORDER — PRENATAL MULTIVITAMIN CH
1.0000 | ORAL_TABLET | Freq: Every day | ORAL | Status: DC
  Administered 2013-06-19 – 2013-06-20 (×2): 1 via ORAL
  Filled 2013-06-19 (×2): qty 1

## 2013-06-19 NOTE — Progress Notes (Signed)
Patient ID: Donna Harrington, female   DOB: 02/16/1981, 32 y.o.   MRN: [id number]  Comfortable with epidural  AFVSS  gen NAD FHTs 120-130's mod var, category 1 toco irr  SVE 10/100/+2-3  31yo G2P1001 at 38+ with IUGR for iol Will push when FOB arrives. Expect SVD

## 2013-06-19 NOTE — Anesthesia Preprocedure Evaluation (Signed)
Anesthesia Evaluation  Patient identified by MRN, date of birth, ID band Patient awake    Reviewed: Allergy & Precautions, H&P , Patient's Chart, lab work & pertinent test results  Airway Mallampati: II TM Distance: >3 FB Neck ROM: full    Dental no notable dental hx.    Pulmonary neg pulmonary ROS,  breath sounds clear to auscultation  Pulmonary exam normal       Cardiovascular negative cardio ROS  Rhythm:regular Rate:Normal     Neuro/Psych negative neurological ROS  negative psych ROS   GI/Hepatic negative GI ROS, Neg liver ROS,   Endo/Other  negative endocrine ROS  Renal/GU negative Renal ROS     Musculoskeletal   Abdominal   Peds  Hematology negative hematology ROS (+)   Anesthesia Other Findings Hx of varicella     Anemia        Abnormal Pap smear     Hx of chlamydia infection        Hx of gonorrhea     HPV (human papilloma virus) anogenital infection        Infection   uti, recurrent yeast and BV IUGR (intrauterine growth restriction) affecting care of mother    Reproductive/Obstetrics (+) Pregnancy                           Anesthesia Physical Anesthesia Plan  ASA: II  Anesthesia Plan: Epidural   Post-op Pain Management:    Induction:   Airway Management Planned:   Additional Equipment:   Intra-op Plan:   Post-operative Plan:   Informed Consent: I have reviewed the patients History and Physical, chart, labs and discussed the procedure including the risks, benefits and alternatives for the proposed anesthesia with the patient or authorized representative who has indicated his/her understanding and acceptance.     Plan Discussed with:   Anesthesia Plan Comments:         Anesthesia Quick Evaluation

## 2013-06-19 NOTE — Progress Notes (Signed)
Post Partum Day 0 Subjective: no complaints and pain controlled, nl lochia  Objective: Blood pressure 102/70, pulse 93, temperature 98.2 F (36.8 C), temperature source Oral, resp. rate 18, height 5\' 6"  (1.676 m), weight 66.225 kg (146 lb), last menstrual period 09/25/2012, SpO2 100.00%, unknown if currently breastfeeding.  Physical Exam:  General: alert and no distress Lochia: appropriate Uterine Fundus: firm   Recent Labs  06/18/13 1840  HGB 11.9*  HCT 35.7*    Assessment/Plan: Breastfeeding and Lactation consult.  Routine PP care.   LOS: 1 day   BOVARD,Jalexis Breed 06/19/2013, 8:27 AM

## 2013-06-19 NOTE — Anesthesia Postprocedure Evaluation (Signed)
  Anesthesia Post-op Note  Anesthesia Post Note  Patient: Donna Harrington  Procedure(s) Performed: * No procedures listed *  Anesthesia type: Epidural  Patient location: Mother/Baby  Post pain: Pain level controlled  Post assessment: Post-op Vital signs reviewed  Last Vitals:  Filed Vitals:   06/19/13 1139  BP: 100/65  Pulse: 81  Temp: 36.7 C  Resp: 18    Post vital signs: Reviewed  Level of consciousness:alert  Complications: No apparent anesthesia complications

## 2013-06-19 NOTE — Anesthesia Procedure Notes (Signed)
Epidural Patient location during procedure: OB Start time: 06/19/2013 2:44 AM  Staffing Anesthesiologist: Angus Seller., Harrell Gave. Performed by: anesthesiologist   Preanesthetic Checklist Completed: patient identified, site marked, surgical consent, pre-op evaluation, timeout performed, IV checked, risks and benefits discussed and monitors and equipment checked  Epidural Patient position: sitting Prep: site prepped and draped and DuraPrep Patient monitoring: continuous pulse ox and blood pressure Approach: midline Injection technique: LOR air and LOR saline  Needle:  Needle type: Tuohy  Needle gauge: 17 G Needle length: 9 cm and 9 Needle insertion depth: 5 cm cm Catheter type: closed end flexible Catheter size: 19 Gauge Catheter at skin depth: 10 cm Test dose: negative  Assessment Events: blood not aspirated, injection not painful, no injection resistance, negative IV test and no paresthesia  Additional Notes Patient identified.  Risk benefits discussed including failed block, incomplete pain control, headache, nerve damage, paralysis, blood pressure changes, nausea, vomiting, reactions to medication both toxic or allergic, and postpartum back pain.  Patient expressed understanding and wished to proceed.  All questions were answered.  Sterile technique used throughout procedure and epidural site dressed with sterile barrier dressing. No paresthesia or other complications noted.The patient did not experience any signs of intravascular injection such as tinnitus or metallic taste in mouth nor signs of intrathecal spread such as rapid motor block. Please see nursing notes for vital signs.

## 2013-06-19 NOTE — Progress Notes (Signed)
UR chart review completed.  

## 2013-06-20 ENCOUNTER — Encounter (HOSPITAL_COMMUNITY): Payer: Self-pay | Admitting: *Deleted

## 2013-06-20 LAB — CBC
Hemoglobin: 11.4 g/dL — ABNORMAL LOW (ref 12.0–15.0)
MCH: 28.5 pg (ref 26.0–34.0)
MCV: 86.8 fL (ref 78.0–100.0)
RBC: 4 MIL/uL (ref 3.87–5.11)

## 2013-06-20 NOTE — Progress Notes (Signed)
Post Partum Day 1 Subjective: no complaints, up ad lib, voiding, tolerating PO and nl lochia, pain controlled  Objective: Blood pressure 113/74, pulse 69, temperature 97.7 F (36.5 C), temperature source Oral, resp. rate 18, height 5\' 6"  (1.676 m), weight 66.225 kg (146 lb), last menstrual period 09/25/2012, SpO2 100.00%, unknown if currently breastfeeding.  Physical Exam:  General: alert and no distress Lochia: appropriate Uterine Fundus: firm  Recent Labs  06/18/13 1840 06/20/13 0601  HGB 11.9* 11.4*  HCT 35.7* 34.7*    Assessment/Plan: Plan for discharge tomorrow, Breastfeeding and Lactation consult.   LOS: 2 days   BOVARD,Koen Antilla 06/20/2013, 8:28 AM

## 2013-06-21 MED ORDER — PRENATAL MULTIVITAMIN CH
1.0000 | ORAL_TABLET | Freq: Every morning | ORAL | Status: AC
Start: 2013-06-21 — End: ?

## 2013-06-21 MED ORDER — IBUPROFEN 800 MG PO TABS: 800.0000 mg | ORAL_TABLET | Freq: Three times a day (TID) | ORAL | Status: DC | PRN

## 2013-06-21 MED ORDER — OXYCODONE-ACETAMINOPHEN 5-325 MG PO TABS: 1.0000 | ORAL_TABLET | Freq: Four times a day (QID) | ORAL | Status: DC | PRN

## 2013-06-21 NOTE — Discharge Summary (Signed)
Obstetric Discharge Summary Reason for Admission: induction of labor Prenatal Procedures: none Intrapartum Procedures: spontaneous vaginal delivery Postpartum Procedures: none Complications-Operative and Postpartum: none Hemoglobin  Date Value Range Status  06/20/2013 11.4* 12.0 - 15.0 g/dL Final     HCT  Date Value Range Status  06/20/2013 34.7* 36.0 - 46.0 % Final    Physical Exam:  General: alert and no distress Lochia: appropriate Uterine Fundus: firm  Discharge Diagnoses: Term Pregnancy-delivered and IUGR fetus  Discharge Information: Date: 06/21/2013 Activity: pelvic rest Diet: routine Medications: PNV, Ibuprofen and Percocet Condition: stable Instructions: refer to practice specific booklet Discharge to: home Follow-up Information   Follow up with BOVARD,Donna Papania, MD. Schedule an appointment as soon as possible for a visit in 6 weeks.   Contact information:   [address] [place] [address] [phone]       Newborn Data: Live born female  Birth Weight: 5 lb 9 oz (2523 g) APGAR: 8, 9  Home with mother.  BOVARD,Donna Harrington 06/21/2013, 10:17 AM

## 2013-06-21 NOTE — Progress Notes (Signed)
Post Partum Day 2 Subjective: no complaints, up ad lib, voiding, tolerating PO and nl lochia, pain controlled  Objective: Blood pressure 108/76, pulse 80, temperature 97.3 F (36.3 C), temperature source Oral, resp. rate 18, height 5\' 6"  (1.676 m), weight 66.225 kg (146 lb), last menstrual period 09/25/2012, SpO2 100.00%, unknown if currently breastfeeding.  Physical Exam:  General: alert and no distress Lochia: appropriate Uterine Fundus: firm  Recent Labs  06/18/13 1840 06/20/13 0601  HGB 11.9* 11.4*  HCT 35.7* 34.7*    Assessment/Plan: Discharge home, Breastfeeding and Lactation consult.  D/c with motrin/percocet/pnv.  F/u 6 weeks   LOS: 3 days   BOVARD,Raschelle Wisenbaker 06/21/2013, 9:58 AM

## 2014-09-21 ENCOUNTER — Encounter (HOSPITAL_COMMUNITY): Payer: Self-pay | Admitting: *Deleted

## 2015-01-17 ENCOUNTER — Emergency Department (HOSPITAL_COMMUNITY): Payer: BLUE CROSS/BLUE SHIELD

## 2015-01-17 ENCOUNTER — Emergency Department (HOSPITAL_COMMUNITY)
Admission: EM | Admit: 2015-01-17 | Discharge: 2015-01-17 | Disposition: A | Discharge status: Home / Self Care | Payer: BLUE CROSS/BLUE SHIELD | Attending: Emergency Medicine | Admitting: Emergency Medicine

## 2015-01-17 ENCOUNTER — Encounter (HOSPITAL_COMMUNITY): Payer: Self-pay | Admitting: Emergency Medicine

## 2015-01-17 DIAGNOSIS — Z8619 Personal history of other infectious and parasitic diseases: Secondary | ICD-10-CM | POA: Diagnosis not present

## 2015-01-17 DIAGNOSIS — W228XXA Striking against or struck by other objects, initial encounter: Secondary | ICD-10-CM | POA: Insufficient documentation

## 2015-01-17 DIAGNOSIS — Y9301 Activity, walking, marching and hiking: Secondary | ICD-10-CM | POA: Insufficient documentation

## 2015-01-17 DIAGNOSIS — Z79899 Other long term (current) drug therapy: Secondary | ICD-10-CM | POA: Diagnosis not present

## 2015-01-17 DIAGNOSIS — Y9289 Other specified places as the place of occurrence of the external cause: Secondary | ICD-10-CM | POA: Diagnosis not present

## 2015-01-17 DIAGNOSIS — Y998 Other external cause status: Secondary | ICD-10-CM | POA: Insufficient documentation

## 2015-01-17 DIAGNOSIS — Z862 Personal history of diseases of the blood and blood-forming organs and certain disorders involving the immune mechanism: Secondary | ICD-10-CM | POA: Insufficient documentation

## 2015-01-17 DIAGNOSIS — S92512A Displaced fracture of proximal phalanx of left lesser toe(s), initial encounter for closed fracture: Secondary | ICD-10-CM | POA: Diagnosis not present

## 2015-01-17 DIAGNOSIS — S92912A Unspecified fracture of left toe(s), initial encounter for closed fracture: Secondary | ICD-10-CM

## 2015-01-17 DIAGNOSIS — S99922A Unspecified injury of left foot, initial encounter: Secondary | ICD-10-CM | POA: Diagnosis present

## 2015-01-17 MED ORDER — HYDROCODONE-ACETAMINOPHEN 5-325 MG PO TABS: 1.0000 | ORAL_TABLET | ORAL | Status: AC | PRN

## 2015-01-17 MED ORDER — IBUPROFEN 200 MG PO TABS
600.0000 mg | ORAL_TABLET | Freq: Once | ORAL | Status: AC
  Administered 2015-01-17: 600 mg via ORAL
  Filled 2015-01-17: qty 3

## 2015-01-17 MED ORDER — NAPROXEN 500 MG PO TABS: 500.0000 mg | ORAL_TABLET | Freq: Two times a day (BID) | ORAL | Status: AC

## 2015-01-17 NOTE — ED Notes (Signed)
Pt stubbed toe on l/foot on a table leg. Concerned that 2nd  toe on l/foot may be broken . Did not ice or medicate prior to this assessment

## 2015-01-17 NOTE — ED Provider Notes (Signed)
CSN: [id number]     Arrival date & time 01/17/15  1119 History   First MD Initiated Contact with Patient 01/17/15 1136     Chief Complaint  Patient presents with  . Foot Pain    l/foot pain, 2nd toe     (Consider location/radiation/quality/duration/timing/severity/associated sxs/prior Treatment) Patient is a 34 y.o. female presenting with foot injury. The history is provided by the patient.  Foot Injury Location:  Foot Injury: yes   Mechanism of injury comment:  Hit on table leg Foot location:  L foot Pain details:    Quality:  Shooting and throbbing   Radiates to:  Does not radiate   Severity:  Severe   Onset quality:  Sudden   Timing:  Constant   Progression:  Worsening Chronicity:  New Foreign body present:  No foreign bodies Prior injury to area:  No Relieved by:  None tried Worsened by:  Bearing weight Ineffective treatments:  None tried Associated symptoms: swelling    Donna Harrington is a 34 y.o. female who presents to the ED with pain to the left second toe. She states she was walking and hit her toe on a table leg. She complains of pain and swelling.  Past Medical History  Diagnosis Date  . Hx of varicella   . Anemia   . Abnormal Pap smear   . Hx of chlamydia infection   . Hx of gonorrhea   . HPV (human papilloma virus) anogenital infection   . Infection     uti, recurrent yeast and BV  . IUGR (intrauterine growth restriction) affecting care of mother 06/18/2013  . SVD (spontaneous vaginal delivery) 06/19/2013   Past Surgical History  Procedure Laterality Date  . No past surgeries     Family History  Problem Relation Age of Onset  . Heart disease Paternal Grandmother   . Stroke Paternal Grandmother   . Alcohol abuse Neg Hx   . Arthritis Neg Hx   . Asthma Neg Hx   . Birth defects Neg Hx   . Cancer Neg Hx   . COPD Neg Hx   . Diabetes Neg Hx   . Depression Neg Hx   . Drug abuse Neg Hx   . Early death Neg Hx   . Hearing loss Neg Hx   .  Hyperlipidemia Neg Hx   . Mental illness Neg Hx   . Learning disabilities Neg Hx   . Kidney disease Neg Hx   . Mental retardation Neg Hx   . Miscarriages / Stillbirths Neg Hx   . Hypertension Other     unsure who  . Hypertension Mother    History  Substance Use Topics  . Smoking status: Never Smoker   . Smokeless tobacco: Never Used  . Alcohol Use: Yes   OB History    Gravida Para Term Preterm AB TAB SAB Ectopic Multiple Living   Review of Systems Negative except as stated in HPI   Allergies  Bee venom  Home Medications   Prior to Admission medications   Medication Sig Start Date End Date Taking? Authorizing Provider  HYDROcodone-acetaminophen (NORCO) 5-325 MG per tablet Take 1 tablet by mouth every 4 (four) hours as needed for moderate pain. 01/17/15   Donna Orlene Och, NP  naproxen (NAPROSYN) 500 MG tablet Take 1 tablet (500 mg total) by mouth 2 (two) times daily. 01/17/15   Donna Orlene Och, NP  Prenatal Vit-Fe  Fumarate-FA (PRENATAL MULTIVITAMIN) TABS Take 1 tablet by mouth every morning. 06/21/13   Donna Bovard-Stuckert, MD   BP 113/80 mmHg  Pulse 68  Temp(Src) 97.9 F (36.6 C) (Oral)  Resp 16  SpO2 100%  LMP 01/15/2015  Breastfeeding? No Physical Exam  Constitutional: She is oriented to person, place, and time. She appears well-developed and well-nourished. No distress.  HENT:  Head: Normocephalic.  Eyes: EOM are normal.  Neck: Neck supple.  Cardiovascular: Normal rate.   Pulmonary/Chest: Effort normal.  Musculoskeletal:       Feet:  Left second toe with swelling, decreased range of motion due to pain, no decreased cap refill, no deformity, good touch sensation. Pedal pulse 2+.  Neurological: She is alert and oriented to person, place, and time. No cranial nerve deficit.  Skin: Skin is warm and dry.  Psychiatric: She has a normal mood and affect. Her behavior is normal.  Nursing note and vitals reviewed.   ED Course  Procedures (including  critical care time) Labs Review Labs Reviewed - No data to display  Imaging Review Dg Toe 2nd Left  01/17/2015   CLINICAL DATA:  Fall, left second toe pain/injury  EXAM: LEFT SECOND TOE  COMPARISON:  None.  FINDINGS: Minimally displaced fracture involving the distal aspect of the second proximal phalanx with suspected intra-articular extension.  Associated soft tissue swelling.  IMPRESSION: Minimally displaced fracture involving the distal aspect of the second proximal phalanx.   Electronically Signed   By: Charline BillsSriyesh  Krishnan M.D.   On: 01/17/2015 12:50    MDM  34 y.o. female with pain and swelling to the left second toe s/p injury. Buddy tape and post op shoe, pain management and follow up with ortho. Stable for d/c without neurovascular deficits.   Final diagnoses:  Fracture, toe, left, closed, initial encounter       [facility] M Neese, NP 01/19/15 0151  Joya Gaskinsonald W Wickline, MD 01/20/15 [phone]

## 2015-01-17 NOTE — ED Notes (Signed)
Bed: WTR7 Expected date:  Expected time:  Means of arrival:  Comments: EMS- fall, ankle pain

## 2016-05-07 ENCOUNTER — Emergency Department
Admission: EM | Admit: 2016-05-07 | Discharge: 2016-05-07 | Disposition: A | Discharge status: Home / Self Care | Payer: Medicaid Other | Attending: Emergency Medicine | Admitting: Emergency Medicine

## 2016-05-07 ENCOUNTER — Encounter: Payer: Self-pay | Admitting: Emergency Medicine

## 2016-05-07 DIAGNOSIS — Z8742 Personal history of other diseases of the female genital tract: Secondary | ICD-10-CM | POA: Diagnosis not present

## 2016-05-07 DIAGNOSIS — Z79899 Other long term (current) drug therapy: Secondary | ICD-10-CM | POA: Diagnosis not present

## 2016-05-07 DIAGNOSIS — Z8619 Personal history of other infectious and parasitic diseases: Secondary | ICD-10-CM

## 2016-05-07 DIAGNOSIS — N898 Other specified noninflammatory disorders of vagina: Secondary | ICD-10-CM | POA: Diagnosis present

## 2016-05-07 DIAGNOSIS — Z791 Long term (current) use of non-steroidal anti-inflammatories (NSAID): Secondary | ICD-10-CM | POA: Insufficient documentation

## 2016-05-07 DIAGNOSIS — N760 Acute vaginitis: Secondary | ICD-10-CM | POA: Insufficient documentation

## 2016-05-07 LAB — URINALYSIS COMPLETE WITH MICROSCOPIC (ARMC ONLY)
BACTERIA UA: NONE SEEN
Bilirubin Urine: NEGATIVE
GLUCOSE, UA: NEGATIVE mg/dL
Hgb urine dipstick: NEGATIVE
Leukocytes, UA: NEGATIVE
Nitrite: NEGATIVE
PROTEIN: NEGATIVE mg/dL
Specific Gravity, Urine: 1.024 (ref 1.005–1.030)
pH: 5 (ref 5.0–8.0)

## 2016-05-07 LAB — WET PREP, GENITAL
CLUE CELLS WET PREP: NONE SEEN
Sperm: NONE SEEN
Trich, Wet Prep: NONE SEEN
Yeast Wet Prep HPF POC: NONE SEEN

## 2016-05-07 LAB — CHLAMYDIA/NGC RT PCR (ARMC ONLY)
CHLAMYDIA TR: NOT DETECTED
N GONORRHOEAE: NOT DETECTED

## 2016-05-07 LAB — POCT PREGNANCY, URINE: PREG TEST UR: NEGATIVE

## 2016-05-07 MED ORDER — ACYCLOVIR 400 MG PO TABS: 400.0000 mg | ORAL_TABLET | Freq: Three times a day (TID) | ORAL | Status: AC

## 2016-05-07 NOTE — Discharge Instructions (Signed)
Vaginitis Vaginitis is an inflammation of the vagina. It can happen when the normal bacteria and yeast in the vagina grow too much. There are different types. Treatment will depend on the type you have. HOME CARE  Take all medicines as told by your doctor.  Keep your vagina area clean and dry. Avoid soap. Rinse the area with water.  Avoid washing and cleaning out the vagina (douching).  Do not use tampons or have sex (intercourse) until your treatment is done.  Wipe from front to back after going to the restroom.  Wear cotton underwear.  Avoid wearing underwear while you sleep until your vaginitis is gone.  Avoid tight pants. Avoid underwear or nylons without a cotton panel.  Take off wet clothing (such as a bathing suit) as soon as you can.  Use mild, unscented products. Avoid fabric softeners and scented:  Feminine sprays.  Laundry detergents.  Tampons.  Soaps or bubble baths.  Practice safe sex and use condoms. GET HELP RIGHT AWAY IF:   You have belly (abdominal) pain.  You have a fever or lasting symptoms for more than 2-3 days.  You have a fever and your symptoms suddenly get worse. MAKE SURE YOU:   Understand these instructions.  Will watch this condition.  Will get help right away if you are not doing well or get worse.   This information is not intended to replace advice given to you by your health care provider. Make sure you discuss any questions you have with your health care provider.   Document Released: 02/02/2009 Document Revised: 07/31/2012 Document Reviewed: 04/18/2012 Elsevier Interactive Patient Education [employer]2016 [employer]   Your exam shows likely physiological discharge. 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 Take the prescription med as directed. Follow-up with the health department for continued symptoms.

## 2016-05-07 NOTE — ED Provider Notes (Signed)
[facility] Emergency Department Provider Note ____________________________________________  Time seen: 1956  I have reviewed the triage vital signs and the nursing notes.  HISTORY  Chief Complaint  Vaginal Discharge  HPI Donna Harrington is a 35 y.o. female percents to the ED requesting a GYN eval secondary to vaginal discharge. She denies any abnormal vaginal bleeding or pelvic pain. She does note some dysuria and pelvic pressure times prior to voiding. She denies any fevers, chills, sweats. She hasn't had this intermittent discharge the last 1-2 weeks. She denies any pain or discomfort with intercourse. 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 She reports previous outbreak of blisters to the same site. She has never been evaluated when the bumps are present.   Past Medical History  Diagnosis Date  . Hx of varicella   . Anemia   . Abnormal Pap smear   . Hx of chlamydia infection   . Hx of gonorrhea   . HPV (human papilloma virus) anogenital infection   . Infection     uti, recurrent yeast and BV  . IUGR (intrauterine growth restriction) affecting care of mother 06/18/2013  . SVD (spontaneous vaginal delivery) 06/19/2013    Patient Active Problem List   Diagnosis Date Noted  . SVD (spontaneous vaginal delivery) 06/19/2013  . IUGR (intrauterine growth restriction) affecting care of mother 06/18/2013    Past Surgical History  Procedure Laterality Date  . No past surgeries      Current Outpatient Rx  Name  Route  Sig  Dispense  Refill  . acyclovir (ZOVIRAX) 400 MG tablet   Oral   Take 1 tablet (400 mg total) by mouth 3 (three) times daily.   35 tablet   1   . HYDROcodone-acetaminophen (NORCO) 5-325 MG per tablet   Oral   Take 1 tablet by mouth every 4 (four) hours as needed for moderate pain.   20 tablet   0   . naproxen (NAPROSYN) 500 MG tablet   Oral   Take 1 tablet (500 mg total) by mouth  2 (two) times daily.   30 tablet   0   . Prenatal Vit-Fe Fumarate-FA (PRENATAL MULTIVITAMIN) TABS   Oral   Take 1 tablet by mouth every morning.   30 tablet   12     Allergies Bee venom  Family History  Problem Relation Age of Onset  . Heart disease Paternal Grandmother   . Stroke Paternal Grandmother   . Alcohol abuse Neg Hx   . Arthritis Neg Hx   . Asthma Neg Hx   . Birth defects Neg Hx   . Cancer Neg Hx   . COPD Neg Hx   . Diabetes Neg Hx   . Depression Neg Hx   . Drug abuse Neg Hx   . Early death Neg Hx   . Hearing loss Neg Hx   . Hyperlipidemia Neg Hx   . Mental illness Neg Hx   . Learning disabilities Neg Hx   . Kidney disease Neg Hx   . Mental retardation Neg Hx   . Miscarriages / Stillbirths Neg Hx   . Hypertension Other     unsure who  . Hypertension Mother     Social History Social History  Substance Use Topics  . Smoking status: Never Smoker   . Smokeless tobacco: Never Used  . Alcohol Use: Yes   Review of Systems  Constitutional: Negative for fever. Gastrointestinal: Negative for  abdominal pain, vomiting and diarrhea. Genitourinary: Negative for dysuria. Reports vaginal discharge as above.  Musculoskeletal: Negative for back pain. Skin: Negative for rash. Neurological: Negative for headaches, focal weakness or numbness. ____________________________________________  PHYSICAL EXAM:  VITAL SIGNS: ED Triage Vitals  Enc Vitals Group     BP 05/07/16 1900 121/69 mmHg     Pulse Rate 05/07/16 1900 84     Resp 05/07/16 1900 20     Temp 05/07/16 1900 98.6 F (37 C)     Temp Source 05/07/16 1900 Oral     SpO2 05/07/16 1900 98 %     Weight 05/07/16 1900 145 lb (65.772 kg)     Height 05/07/16 1900  (1.676 m)     Head Cir --      Peak Flow --      Pain Score --      Pain Loc --      Pain Edu? --      Excl. in GC? --    Constitutional: Alert and oriented. Well appearing and in no distress. Head: Normocephalic and  atraumatic. Respiratory: Normal respiratory effort.  Gastrointestinal: Soft and nontender. No distention. GU: Normal external genitalia. Scant, thick, white vaginal discharge noted in the vagina. No CMT or adnexal masses. No external lesions on exam.  Skin:  Skin is warm, dry and intact. No rash noted. ____________________________________________    LABS (pertinent positives/negatives) Labs Reviewed  WET PREP, GENITAL - Abnormal; Notable for the following:    WBC, Wet Prep HPF POC FEW (*)    All other components within normal limits  URINALYSIS COMPLETEWITH MICROSCOPIC ([facility] ONLY) - Abnormal; Notable for the following:    Color, Urine YELLOW (*)    APPearance CLEAR (*)    Ketones, ur TRACE (*)    Squamous Epithelial / LPF 0-5 (*)    All other components within normal limits  CHLAMYDIA/NGC RT PCR ([facility] ONLY)  POC URINE PREG, ED  POCT PREGNANCY, URINE  ____________________________________________  INITIAL IMPRESSION / ASSESSMENT AND PLAN / ED COURSE  Patient with a vaginitis not confirmed to be yeast, BV or trichomoniasis. Her exam may represent a physiological discharge at this time.She is being treated for her history of herpes genitalia and current prodrome with a prescription for acyclovir. She is advised to follow with the local health department for further evaluation and testing as necessary. ____________________________________________  FINAL CLINICAL IMPRESSION(S) / ED DIAGNOSES  Final diagnoses:  Vaginitis  H/O herpes genitalis     Lissa Hoard, PA-C 05/07/16 2213  Emily Filbert, MD 05/07/16 2245

## 2016-05-07 NOTE — ED Notes (Signed)
Patient presents to the ED stating, "I need a woman's check."  Patient reports vaginal discharge, denies vaginal bleeding, denies pain.   Patient denies pregnancy.  Patient is in no obvious distress at this time.

## 2016-05-07 NOTE — ED Notes (Signed)
States she developed some vaginal discharge about 1-2 weeks ago  States she noticed discharge after last period

## 2016-09-12 ENCOUNTER — Encounter (HOSPITAL_BASED_OUTPATIENT_CLINIC_OR_DEPARTMENT_OTHER): Payer: Self-pay | Admitting: Emergency Medicine

## 2016-09-12 ENCOUNTER — Emergency Department (HOSPITAL_BASED_OUTPATIENT_CLINIC_OR_DEPARTMENT_OTHER)
Admission: EM | Admit: 2016-09-12 | Discharge: 2016-09-12 | Disposition: A | Discharge status: Home / Self Care | Payer: Medicaid Other | Attending: Emergency Medicine | Admitting: Emergency Medicine

## 2016-09-12 DIAGNOSIS — Y929 Unspecified place or not applicable: Secondary | ICD-10-CM | POA: Insufficient documentation

## 2016-09-12 DIAGNOSIS — S61412A Laceration without foreign body of left hand, initial encounter: Secondary | ICD-10-CM | POA: Insufficient documentation

## 2016-09-12 DIAGNOSIS — Y939 Activity, unspecified: Secondary | ICD-10-CM | POA: Insufficient documentation

## 2016-09-12 DIAGNOSIS — W25XXXA Contact with sharp glass, initial encounter: Secondary | ICD-10-CM | POA: Diagnosis not present

## 2016-09-12 DIAGNOSIS — Z23 Encounter for immunization: Secondary | ICD-10-CM | POA: Insufficient documentation

## 2016-09-12 DIAGNOSIS — Y999 Unspecified external cause status: Secondary | ICD-10-CM | POA: Diagnosis not present

## 2016-09-12 MED ORDER — TETANUS-DIPHTH-ACELL PERTUSSIS 5-2.5-18.5 LF-MCG/0.5 IM SUSP
0.5000 mL | Freq: Once | INTRAMUSCULAR | Status: AC
  Administered 2016-09-12: 0.5 mL via INTRAMUSCULAR
  Filled 2016-09-12: qty 0.5

## 2016-09-12 MED ORDER — BACITRACIN ZINC 500 UNIT/GM EX OINT
TOPICAL_OINTMENT | Freq: Once | CUTANEOUS | Status: AC
  Administered 2016-09-12: 1 via TOPICAL

## 2016-09-12 MED ORDER — LIDOCAINE-EPINEPHRINE (PF) 2 %-1:200000 IJ SOLN
10.0000 mL | Freq: Once | INTRAMUSCULAR | Status: AC
  Administered 2016-09-12: 10 mL
  Filled 2016-09-12: qty 10

## 2016-09-12 NOTE — Discharge Instructions (Signed)
Remove the bandage after 24 hours. You must wait at least 8 hours after the wound repair to wash the wound. Clean the wound and surrounding area gently with tap water and mild soap. Rinse well and blot dry. Do not scrub the wound, as this may cause the wound edges to come apart. You may shower, but avoid submerging the wound, such as with a bath or swimming. Clean the wound daily to prevent infection. Reapplication of a topical antibiotic ointment, such as Neosporin, will decrease scab formation and reduce any scarring. You may use Tylenol, naproxen, ibuprofen for pain.  Return to the ED in 8-10 days for suture removal.  Return to the ED sooner should the wound edges come apart or signs of infection arise, such as spreading redness, puffiness/swelling, pus draining from the wound, severe increase in pain, or any other major issues.

## 2016-09-12 NOTE — ED Notes (Signed)
Patient is alert and oriented x3.  She was given DC instructions and follow up visit instructions.  Patient gave verbal understanding. She was DC ambulatory under her own power to home.  V/S stable.  He was not showing any signs of distress on DC 

## 2016-09-12 NOTE — ED Triage Notes (Signed)
Pt has laceration to left hand from broken light bulb in refrigerator.

## 2016-09-12 NOTE — ED Provider Notes (Signed)
[facility]   CSN: [id number] Arrival date & time: 09/12/16  2208  By signing my name below, I, [name], attest that this documentation has been prepared under the direction and in the presence of Maribel Luis, PA-C Electronically Signed: [name], ED Scribe. 09/12/16. 10:26 PM.   History   Chief Complaint Chief Complaint  Patient presents with  . Laceration    HPI Donna Harrington is a 35 y.o. female who presents to the Emergency Department complaining of laceration onset 4:45 PM today. Pt notes that she had a broken light in her fridge that she attempted to remove that accidentally cut her. Pt notes that she is unsure of when her last tetanus vaccination was. She states that she is having associated symptoms of minor left hand pain. She states that she has not tried any medications for the relief of her symptoms. Denies deficits or other injuries.    The history is provided by the patient. No language interpreter was used.    Past Medical History:  Diagnosis Date  . Abnormal Pap smear   . Anemia   . HPV (human papilloma virus) anogenital infection   . Hx of chlamydia infection   . Hx of gonorrhea   . Hx of varicella   . Infection    uti, recurrent yeast and BV  . IUGR (intrauterine growth restriction) affecting care of mother 06/18/2013  . SVD (spontaneous vaginal delivery) 06/19/2013    Patient Active Problem List   Diagnosis Date Noted  . SVD (spontaneous vaginal delivery) 06/19/2013  . IUGR (intrauterine growth restriction) affecting care of mother 06/18/2013    Past Surgical History:  Procedure Laterality Date  . NO PAST SURGERIES      OB History    Gravida Para Term Preterm AB Living   5 2 2   3 2    SAB TAB Ectopic Multiple Live Births     3     2       Home Medications    Prior to Admission medications   Medication Sig Start Date End Date Taking? Authorizing Provider  acyclovir (ZOVIRAX) 400 MG tablet Take 1 tablet  (400 mg total) by mouth 3 (three) times daily. 05/07/16   Jenise V Bacon Menshew, PA-C  HYDROcodone-acetaminophen (NORCO) 5-325 MG per tablet Take 1 tablet by mouth every 4 (four) hours as needed for moderate pain. 01/17/15   Hope Orlene Och, NP  naproxen (NAPROSYN) 500 MG tablet Take 1 tablet (500 mg total) by mouth 2 (two) times daily. 01/17/15   Hope Orlene Och, NP  Prenatal Vit-Fe Fumarate-FA (PRENATAL MULTIVITAMIN) TABS Take 1 tablet by mouth every morning. 06/21/13   Sherian Rein, MD    Family History Family History  Problem Relation Age of Onset  . Hypertension Mother   . Heart disease Paternal Grandmother   . Stroke Paternal Grandmother   . Hypertension Other     unsure who  . Alcohol abuse Neg Hx   . Arthritis Neg Hx   . Asthma Neg Hx   . Birth defects Neg Hx   . Cancer Neg Hx   . COPD Neg Hx   . Diabetes Neg Hx   . Depression Neg Hx   . Drug abuse Neg Hx   . Early death Neg Hx   . Hearing loss Neg Hx   . Hyperlipidemia Neg Hx   . Mental illness Neg Hx   . Learning disabilities Neg Hx   . Kidney disease Neg  Hx   . Mental retardation Neg Hx   . Miscarriages / Stillbirths Neg Hx     Social History Social History  Substance Use Topics  . Smoking status: Never Smoker  . Smokeless tobacco: Never Used  . Alcohol use Yes     Allergies   Bee venom   Review of Systems Review of Systems  Constitutional: Negative for chills and fever.  Musculoskeletal: Positive for arthralgias (left hand). Negative for joint swelling.  Skin: Positive for wound (laceration to left hand). Negative for color change.     Physical Exam Updated Vital Signs BP 100/58 (BP Location: Right Arm)   Pulse 75   Temp 98.1 F (36.7 C) (Oral)   Resp 16   Ht 5\' 6"  (1.676 m)   Wt 127 lb (57.6 kg)   SpO2 100%   BMI 20.50 kg/m   Physical Exam  Constitutional: She appears well-developed and well-nourished. No distress.  HENT:  Head: Normocephalic and atraumatic.  Eyes: Conjunctivae are  normal.  Neck: Neck supple.  Cardiovascular: Normal rate and regular rhythm.   Pulmonary/Chest: Effort normal. No respiratory distress.  Abdominal: She exhibits no distension.  Musculoskeletal: She exhibits tenderness.       Left hand: She exhibits laceration.  1 cm straight laceration to the dorsum of left hand lateral to 2nd metacarpal.   Neurological: She is alert.  Skin: Skin is warm and dry. She is not diaphoretic.  Psychiatric: She has a normal mood and affect. Her behavior is normal.  Nursing note and vitals reviewed.    ED Treatments / Results  DIAGNOSTIC STUDIES: Oxygen Saturation is 100% on RA, nl by my interpretation.    COORDINATION OF CARE: 10:25 PM Discussed treatment plan with pt at bedside which includes wound care, laceration repair, and update tetanus vaccination and pt agreed to plan.   Procedures .Marland Kitchen.Laceration Repair Date/Time: 09/12/2016 11:13 PM Performed by: Anselm PancoastJOY, Orva Gwaltney C Authorized by: Anselm PancoastJOY, Ayn Domangue C   Consent:    Consent obtained:  Verbal   Consent given by:  Patient   Risks discussed:  Infection, pain, need for additional repair, retained foreign body and poor wound healing   Alternatives discussed:  Observation Anesthesia (see MAR for exact dosages):    Anesthesia method:  Local infiltration   Local anesthetic:  Lidocaine 2% WITH epi Laceration details:    Location:  Hand   Hand location:  L hand, dorsum   Length (cm):  1 Repair type:    Repair type:  Simple Pre-procedure details:    Preparation:  Patient was prepped and draped in usual sterile fashion Exploration:    Hemostasis achieved with:  Direct pressure   Wound exploration: wound explored through full range of motion and entire depth of wound probed and visualized   Treatment:    Area cleansed with:  Betadine   Amount of cleaning:  Standard   Irrigation solution:  Tap water   Irrigation method:  Tap Skin repair:    Repair method:  Sutures   Suture size:  5-0   Suture material:   Prolene   Suture technique:  Simple interrupted   Number of sutures:  2 Approximation:    Approximation:  Close Post-procedure details:    Dressing:  Sterile dressing and antibiotic ointment   Patient tolerance of procedure:  Tolerated well, no immediate complications     (including critical care time)  Medications Ordered in ED Medications  Tdap (BOOSTRIX) injection 0.5 mL (0.5 mLs Intramuscular Given 09/12/16 2234)  lidocaine-EPINEPHrine (  XYLOCAINE W/EPI) 2 %-1:200000 (PF) injection 10 mL (10 mLs Infiltration Given 09/12/16 2255)     Initial Impression / Assessment and Plan / ED Course  I have reviewed the triage vital signs and the nursing notes.   Clinical Course    Patient presents with a laceration to the left hand. No functional deficits. Repaired without immediate complication. Patient to return in 8-10 days for suture removal. Patient to return sooner for signs of infection or dehiscence. Wound care discussed.     Final Clinical Impressions(s) / ED Diagnoses   Final diagnoses:  Laceration of left hand without foreign body, initial encounter    New Prescriptions New Prescriptions   No medications on file   I personally performed the services described in this documentation, which was scribed in my presence. The recorded information has been reviewed and is accurate.    Anselm Pancoast, PA-C 09/12/16 9147    Geoffery Lyons, MD 09/12/16 [phone]

## 2016-09-24 ENCOUNTER — Emergency Department (HOSPITAL_BASED_OUTPATIENT_CLINIC_OR_DEPARTMENT_OTHER)
Admission: EM | Admit: 2016-09-24 | Discharge: 2016-09-24 | Disposition: A | Discharge status: Home / Self Care | Payer: Medicaid Other | Attending: Emergency Medicine | Admitting: Emergency Medicine

## 2016-09-24 ENCOUNTER — Encounter (HOSPITAL_BASED_OUTPATIENT_CLINIC_OR_DEPARTMENT_OTHER): Payer: Self-pay | Admitting: Emergency Medicine

## 2016-09-24 DIAGNOSIS — Z4802 Encounter for removal of sutures: Secondary | ICD-10-CM

## 2016-09-24 NOTE — ED Triage Notes (Signed)
Pt in for suture removal, two sutures to L hand placed 10/24. Pt alert, interactive, ambulatory in NAD.

## 2016-09-24 NOTE — ED Provider Notes (Signed)
[facility] Provider Note   CSN: [id number] Arrival date & time: 09/24/16  1348     History   Chief Complaint Chief Complaint  Patient presents with  . Suture / Staple Removal    HPI Hubbard HartshornChristine Rosko is a 35 y.o. female.  The history is provided by the patient. No language interpreter was used.  Suture / Staple Removal    Hubbard HartshornChristine Sadek is a 35 y.o. female who presents to the Emergency Department complaining of suture removal. She injured her hand on a light bulb on October 24 and had sutures at that time. She comes in for suture removal. She reports no problems with the hand. She is able to range her hand without difficulty and no significant pain. No fevers or drainage. Past Medical History:  Diagnosis Date  . Abnormal Pap smear   . Anemia   . HPV (human papilloma virus) anogenital infection   . Hx of chlamydia infection   . Hx of gonorrhea   . Hx of varicella   . Infection    uti, recurrent yeast and BV  . IUGR (intrauterine growth restriction) affecting care of mother 06/18/2013  . SVD (spontaneous vaginal delivery) 06/19/2013    Patient Active Problem List   Diagnosis Date Noted  . SVD (spontaneous vaginal delivery) 06/19/2013  . IUGR (intrauterine growth restriction) affecting care of mother 06/18/2013    Past Surgical History:  Procedure Laterality Date  . NO PAST SURGERIES      OB History    Gravida Para Term Preterm AB Living   5 2 2   3 2    SAB TAB Ectopic Multiple Live Births     3     2       Home Medications    Prior to Admission medications   Medication Sig Start Date End Date Taking? Authorizing Provider  acyclovir (ZOVIRAX) 400 MG tablet Take 1 tablet (400 mg total) by mouth 3 (three) times daily. 05/07/16   Jenise V Bacon Menshew, PA-C  HYDROcodone-acetaminophen (NORCO) 5-325 MG per tablet Take 1 tablet by mouth every 4 (four) hours as needed for moderate pain. 01/17/15   Hope Orlene OchM Neese, NP  naproxen (NAPROSYN) 500 MG tablet  Take 1 tablet (500 mg total) by mouth 2 (two) times daily. 01/17/15   Hope Orlene OchM Neese, NP  Prenatal Vit-Fe Fumarate-FA (PRENATAL MULTIVITAMIN) TABS Take 1 tablet by mouth every morning. 06/21/13   Sherian ReinJody Bovard-Stuckert, MD    Family History Family History  Problem Relation Age of Onset  . Hypertension Mother   . Heart disease Paternal Grandmother   . Stroke Paternal Grandmother   . Hypertension Other     unsure who  . Alcohol abuse Neg Hx   . Arthritis Neg Hx   . Asthma Neg Hx   . Birth defects Neg Hx   . Cancer Neg Hx   . COPD Neg Hx   . Diabetes Neg Hx   . Depression Neg Hx   . Drug abuse Neg Hx   . Early death Neg Hx   . Hearing loss Neg Hx   . Hyperlipidemia Neg Hx   . Mental illness Neg Hx   . Learning disabilities Neg Hx   . Kidney disease Neg Hx   . Mental retardation Neg Hx   . Miscarriages / Stillbirths Neg Hx     Social History Social History  Substance Use Topics  . Smoking status: Never Smoker  . Smokeless tobacco: Never Used  . Alcohol  use Yes     Allergies   Bee venom   Review of Systems Review of Systems  All other systems reviewed and are negative.    Physical Exam Updated Vital Signs BP 107/75   Pulse 61   Temp 97.6 F (36.4 C)   Resp 18   Ht 5\' 6"  (1.676 m)   Wt 127 lb (57.6 kg)   SpO2 100%   BMI 20.50 kg/m   Physical Exam  Constitutional: She is oriented to person, place, and time. She appears well-developed and well-nourished.  HENT:  Head: Normocephalic and atraumatic.  Cardiovascular: Normal rate.   Pulmonary/Chest: Effort normal. No respiratory distress.  Musculoskeletal:  Healing wound to the left dorsal hand with 2 sutures in place. There is no surrounding erythema, edema or exudate. Full range of motion is intact to the digits and wrists.  Neurological: She is alert and oriented to person, place, and time.  Skin: Skin is warm and dry. Capillary refill takes less than 2 seconds.  Psychiatric: She has a normal mood and affect.  Her behavior is normal.  Nursing note and vitals reviewed.    ED Treatments / Results  Labs (all labs ordered are listed, but only abnormal results are displayed) Labs Reviewed - No data to display  EKG  EKG Interpretation None       Radiology No results found.  Procedures Procedures (including critical care time)  Medications Ordered in ED Medications - No data to display   Initial Impression / Assessment and Plan / ED Course  I have reviewed the triage vital signs and the nursing notes.  Pertinent labs & imaging results that were available during my care of the patient were reviewed by me and considered in my medical decision making (see chart for details).  Clinical Course     Patient here for wound check and suture removal. Wound is healing well with no evidence of infection. Sutures removed. Counseled patient on home care, outpatient follow-up and return precautions.  Final Clinical Impressions(s) / ED Diagnoses   Final diagnoses:  Visit for suture removal    New Prescriptions New Prescriptions   No medications on file     Tilden FossaElizabeth Makaylen Thieme, MD 09/24/16 1406

## 2018-12-12 ENCOUNTER — Ambulatory Visit (INDEPENDENT_AMBULATORY_CARE_PROVIDER_SITE_OTHER): Payer: No Typology Code available for payment source | Admitting: Family Practice

## 2018-12-12 VITALS — BP 109/76 | HR 78 | Temp 98.7°F | Resp 16 | Wt 132.0 lb

## 2018-12-12 DIAGNOSIS — N898 Other specified noninflammatory disorders of vagina: Secondary | ICD-10-CM

## 2018-12-12 LAB — PR URINE PREGNANCY TEST HCG, ONSITE: Pregnancy (HCG) (UWNC), URN: NEGATIVE

## 2018-12-12 LAB — PR WET PREP/KOH/TEST, ONSITE
Clue Cells: NEGATIVE
Odor: ABSENT
RBC, Wet Prep: NEGATIVE
Trich: NEGATIVE
Yeast, Wet Prep: NEGATIVE

## 2018-12-12 NOTE — Patient Instructions (Addendum)
It was a pleasure to see you in clinic today. Your Medical Assistant was: Orpah ClintonShannon                You can schedule an appointment to see us by calling [phone] or via eCare.     If labs were ordered today the results are expected to be available via eCare 5 days later. Otherwise, result letters are mailed 7-10 days after your tests are completed. If your physician needs to change your care based on your results, you will receive a phone call to notify you. If you haven't heard from him/her and it has been more than 10 days please give us a call.     [facility] you [facility] choosing [facility].         Dear Stephanie Rosales,    We will contact you with test results    If test results are negative , then Please follow-up with gynecology     Stephanie MetroGil Mazurik, MD'

## 2018-12-12 NOTE — Progress Notes (Signed)
Pt roomed, medications, and allergies verified and updated by Pasquale Matters, MA-C  Aulton Routt, CMA, 12/12/2018 7:16 PM

## 2018-12-12 NOTE — Progress Notes (Signed)
Patient Referred By: No ref. provider found  Patient's PCP: Pcp, None     Subjective:  Patient is a 38 year old female, here to discuss Vaginal Problem (pt c/o vaginal discharge and fishy odor for since last month)    The following portions of the patient's history were reviewed with the patient and updated as appropriate: problem list, current medications, allergies and past medical history.    HPI  Stephanie Rosales is a 38 year old female c/o vaginal odor x ~1 month. Cloudy/white. No vaginal itching. No  Urinary symptoms. No new products. No douching.  LMP last month end of the month  Not worried about STIs    Review of Systems   Constitutional: Negative for chills, diaphoresis, fatigue and fever.   Gastrointestinal: Negative for abdominal pain and vomiting.   Genitourinary: Positive for vaginal discharge. Negative for dysuria, flank pain, frequency, genital sores, hematuria and urgency.   All other systems reviewed and are negative.        Objective:  BP 109/76   Pulse 78   Temp 98.7 F (37.1 C) (Temporal)   Resp 16   Wt 132 lb (59.9 kg)   SpO2 99%     Physical Exam  Vitals signs and nursing note reviewed.   Constitutional:       General: She is not in acute distress.     Appearance: She is well-developed. She is not diaphoretic.   Eyes:      General: No scleral icterus.  Cardiovascular:      Rate and Rhythm: Normal rate and regular rhythm.      Heart sounds: Normal heart sounds. No murmur. No friction rub. No gallop.    Pulmonary:      Effort: Pulmonary effort is normal. No respiratory distress.      Breath sounds: Normal breath sounds. No wheezing or rales.   Abdominal:      General: There is no distension.      Palpations: Abdomen is soft. There is no mass.      Tenderness: There is no tenderness. There is no guarding or rebound.      Hernia: No hernia is present.   Genitourinary:     Vagina: Vaginal discharge present.       Results for orders placed or performed in visit on 12/12/18   WET  PREP/KOH/TEST, ONSITE   Result Value Ref Range    Odor ABSENT     WBC 1+     RBC, Wet Prep NEGATIVE     Bacteria 2+     Clue Cells NEGATIVE     Yeast, Wet Prep NEGATIVE     Epithelial Cells, Wet Prep 2+     Trich NEGATIVE     Other, Wet Prep     URINE PREGNANCY TEST HCG, ONSITE   Result Value Ref Range    Pregnancy (HCG) ([facility]), URN Negative     INTERNAL CONTROL Control Verified           Assessment and Plan:  1. Vaginal discharge  Pt c/o vaginal discharge and odor x 1 month. Neg wet prep in clinic. Will do lab send out to evaluate further. Recommend if labs negative and sx continue to evaluate further with gynecology  - GC and Chlamydia Nucleic Acid Testing  - Gram Stain, Vaginal Fluid  - R/O Yeast w/Direct Exam  - Trichomonas Nucleic Acid

## 2018-12-13 ENCOUNTER — Telehealth (INDEPENDENT_AMBULATORY_CARE_PROVIDER_SITE_OTHER): Payer: Self-pay | Admitting: Family Medicine

## 2018-12-13 LAB — GC&CHLAM NUCLEIC ACID DETECTN
Chlam Trachomatis Nucleic Acid: NEGATIVE
N.Gonorrhoeae(GC) Nucleic Acid: NEGATIVE

## 2018-12-13 LAB — VAGINAL GRAM STAIN
Gram Smear: NONE SEEN
Gram Smear: NONE SEEN

## 2018-12-13 LAB — TRICHOMONAS NUCLEIC ACID: Trichomonas Nucleic Acid Res.: NEGATIVE

## 2018-12-13 NOTE — Telephone Encounter (Signed)
Notify the patient that her tests were negative for gonorrhea Chlamydia and Trichomonas.

## 2018-12-13 NOTE — Result Encounter Note (Signed)
"  altered flora but not typical of BV." Await remaining results (yeast, trich, GCCT). Pt not started on treatment in clinic.   Elsie Lincoln, PA-C

## 2018-12-14 NOTE — Telephone Encounter (Signed)
Relayed message.    [address] [place], [place], [place]    [phone]

## 2018-12-17 NOTE — Result Encounter Note (Signed)
Final still pending

## 2018-12-18 ENCOUNTER — Telehealth (INDEPENDENT_AMBULATORY_CARE_PROVIDER_SITE_OTHER): Payer: Self-pay | Admitting: Family Medicine

## 2018-12-18 LAB — R/O YEAST CULT W/DIRECT EXAM: Stain For Fungus: NONE SEEN

## 2018-12-18 NOTE — Telephone Encounter (Signed)
VM is full, will reattempt.    [address] [place], [place], [place] [phone]

## 2018-12-18 NOTE — Telephone Encounter (Signed)
Relayed message to pt and will contact [facility] to schedule.    [address], [place], [place], [place]

## 2018-12-18 NOTE — Telephone Encounter (Addendum)
It was suggested by the physician who saw her that if the test results all came back negative that she should follow-up with her GYN physician for further workup.  She should check with her insurance to see if she needs a referral from her primary care provider ([facility] or [facility], etc...) or if any physician can make a referral for her.

## 2018-12-18 NOTE — Telephone Encounter (Signed)
Patient understands that all tests came back negative but she is still having discharge with foul odor so she is concerned of the smell  Please advise   Thank you   Byrd Hesselbach

## 2018-12-18 NOTE — Telephone Encounter (Signed)
Notify that the final culture was negative for yeast.

## 2019-08-11 ENCOUNTER — Inpatient Hospital Stay: Payer: Self-pay

## 2019-08-13 ENCOUNTER — Encounter (INDEPENDENT_AMBULATORY_CARE_PROVIDER_SITE_OTHER): Payer: No Typology Code available for payment source

## 2021-04-28 ENCOUNTER — Emergency Department: Payer: Self-pay

## 2024-07-01 ENCOUNTER — Emergency Department (HOSPITAL_COMMUNITY)
Admission: EM | Admit: 2024-07-01 | Discharge: 2024-07-01 | Discharge status: Against Medical Advice | Payer: Self-pay | Attending: Emergency Medicine | Admitting: Emergency Medicine

## 2024-07-01 ENCOUNTER — Other Ambulatory Visit: Payer: Self-pay

## 2024-07-01 ENCOUNTER — Encounter (HOSPITAL_COMMUNITY): Payer: Self-pay

## 2024-07-01 DIAGNOSIS — M79641 Pain in right hand: Secondary | ICD-10-CM | POA: Insufficient documentation

## 2024-07-01 DIAGNOSIS — Z5321 Procedure and treatment not carried out due to patient leaving prior to being seen by health care provider: Secondary | ICD-10-CM | POA: Insufficient documentation

## 2024-07-01 NOTE — ED Triage Notes (Signed)
 Patient ripped off her nail on her right pinky hand 2 days ago. Said it hurts to move, fells warm and swollen.

## 2024-10-06 ENCOUNTER — Other Ambulatory Visit: Payer: Self-pay

## 2024-10-06 ENCOUNTER — Emergency Department
Admission: EM | Admit: 2024-10-06 | Discharge: 2024-10-06 | Disposition: A | Discharge status: Home / Self Care | Payer: Self-pay | Attending: Emergency Medicine | Admitting: Emergency Medicine

## 2024-10-06 DIAGNOSIS — B9689 Other specified bacterial agents as the cause of diseases classified elsewhere: Secondary | ICD-10-CM

## 2024-10-06 DIAGNOSIS — N760 Acute vaginitis: Secondary | ICD-10-CM | POA: Insufficient documentation

## 2024-10-06 LAB — CHLAMYDIA/NGC RT PCR (ARMC ONLY)
Chlamydia Tr: NOT DETECTED
N gonorrhoeae: NOT DETECTED

## 2024-10-06 LAB — WET PREP, GENITAL
Sperm: NONE SEEN
Trich, Wet Prep: NONE SEEN
WBC, Wet Prep HPF POC: <10 (ref <10)
Yeast Wet Prep HPF POC: NONE SEEN

## 2024-10-06 MED ORDER — METRONIDAZOLE 500 MG PO TABS: 500.0000 mg | ORAL_TABLET | Freq: Two times a day (BID) | ORAL | 0 refills | Status: DC

## 2024-10-06 MED ORDER — METRONIDAZOLE 500 MG PO TABS: 500.0000 mg | ORAL_TABLET | Freq: Two times a day (BID) | ORAL | 0 refills | Status: AC

## 2024-10-06 NOTE — ED Provider Notes (Signed)
 [facility] Emergency Department Provider Note     Event Date/Time   First MD Initiated Contact with Patient 10/06/24 2042     (approximate)   History   Vaginal Discharge   HPI  Donna Harrington is a 43 y.o. female presents to the ED for evaluation of excess vaginal discharge and malodorous smell noticed for approximately a month now.  Patient has a history of BV and reports the last time she had the similar symptoms she was positive for BV and symptoms fully resolved with medication.  She denies dysuria, vaginal bleeding, fever, abdominal or pelvic pain.     Physical Exam   Triage Vital Signs: ED Triage Vitals  Encounter Vitals Group     BP 10/06/24 1852 119/79     Girls Systolic BP Percentile --      Girls Diastolic BP Percentile --      Boys Systolic BP Percentile --      Boys Diastolic BP Percentile --      Pulse Rate 10/06/24 1852 85     Resp 10/06/24 1852 17     Temp 10/06/24 1852 97.8 F (36.6 C)     Temp Source 10/06/24 1852 Oral     SpO2 10/06/24 1852 100 %     Weight 10/06/24 1851 130 lb (59 kg)     Height 10/06/24 1851 5' 4 (1.626 m)     Head Circumference --      Peak Flow --      Pain Score 10/06/24 1851 0     Pain Loc --      Pain Education --      Exclude from Growth Chart --     Most recent vital signs: Vitals:   10/06/24 1852  BP: 119/79  Pulse: 85  Resp: 17  Temp: 97.8 F (36.6 C)  SpO2: 100%    General Awake, no distress.  HEENT NCAT.  CV:  Good peripheral perfusion.  RESP:  Normal effort.  ABD:  No distention.  GU:  Deferred by patient   ED Results / Procedures / Treatments   Labs (all labs ordered are listed, but only abnormal results are displayed) Labs Reviewed  WET PREP, GENITAL - Abnormal; Notable for the following components:      Result Value   Clue Cells Wet Prep HPF POC PRESENT (*)    All other components within normal limits  CHLAMYDIA/NGC RT PCR ([facility] ONLY)             No results  found.  PROCEDURES:  Critical Care performed: No  Procedures  MEDICATIONS ORDERED IN ED: Medications - No data to display   IMPRESSION / MDM / ASSESSMENT AND PLAN / ED COURSE  I reviewed the triage vital signs and the nursing notes.                               43 y.o. female presents to the emergency department for evaluation and treatment of vaginal discharge. See HPI for further details.   Differential diagnosis includes, but is not limited to BV, yeast infection, STD, vaginitis  Patient's presentation is most consistent with acute, uncomplicated illness.  Patient is alert and oriented.  She is hemodynamically stable and well-appearing on initial assessment.  Wet prep positive for clue cells.  Patient has low concerns for STD STI however swabs were sent to lab.  STD panel is pending.  Patient would  like to be discharged and called with these results.  Flagyl  sent to pharmacy.  Patient is stable condition for discharge home.  FINAL CLINICAL IMPRESSION(S) / ED DIAGNOSES   Final diagnoses:  BV (bacterial vaginosis)   Rx / DC Orders   ED Discharge Orders          Ordered    metroNIDAZOLE  (FLAGYL ) 500 MG tablet  2 times daily,   Status:  Discontinued        10/06/24 2242    metroNIDAZOLE  (FLAGYL ) 500 MG tablet  2 times daily        10/06/24 2243             Note:  This document was prepared using Dragon voice recognition software and may include unintentional dictation errors.    Margrette, Jaydee Ingman A, PA-C 10/06/24 2247    Dorothyann Drivers, MD 10/06/24 [phone]

## 2024-10-06 NOTE — ED Triage Notes (Signed)
 Pt sts that she wants to get checked for BV. PT sts that she is having vaginal discharge.

## 2024-10-06 NOTE — Discharge Instructions (Addendum)
 If pending results are positive you will receive a call and appropriate antibiotic sent to your pharmacy.
